# Patient Record
Sex: Female | Born: 1980 | Race: White | Hispanic: No | Smoking: Current every day smoker
Health system: Western US, Academic
[De-identification: ages and names within clinical notes are randomized; demographics above are authoritative.]

## PROBLEM LIST (undated history)

## (undated) DIAGNOSIS — F209 Schizophrenia, unspecified: Secondary | ICD-10-CM

## (undated) DIAGNOSIS — G8929 Other chronic pain: Secondary | ICD-10-CM

## (undated) DIAGNOSIS — F319 Bipolar disorder, unspecified: Secondary | ICD-10-CM

## (undated) DIAGNOSIS — F909 Attention-deficit hyperactivity disorder, unspecified type: Secondary | ICD-10-CM

## (undated) DIAGNOSIS — M549 Dorsalgia, unspecified: Secondary | ICD-10-CM

## (undated) DIAGNOSIS — F419 Anxiety disorder, unspecified: Secondary | ICD-10-CM

## (undated) DIAGNOSIS — R569 Unspecified convulsions: Secondary | ICD-10-CM

## (undated) HISTORY — PX: TONSILLECTOMY: SUR1361

## (undated) HISTORY — PX: TONSILLECTOMY AND ADENOIDECTOMY: SHX001831

## (undated) HISTORY — PX: NO SURGICAL HISTORY: 101002

## (undated) HISTORY — PX: TONSILLECTOMY: SHX001830

---

## 2000-11-25 ENCOUNTER — Other Ambulatory Visit: Payer: Self-pay | Admitting: Emergency Medicine

## 2002-07-24 ENCOUNTER — Ambulatory Visit

## 2002-07-25 ENCOUNTER — Encounter: Admitting: OBSTETRICS/GYN

## 2002-08-02 ENCOUNTER — Ambulatory Visit: Admit: 2002-08-02 | Attending: OBSTETRICS/GYN | Admitting: OBSTETRICS/GYN

## 2002-08-09 ENCOUNTER — Ambulatory Visit

## 2002-08-10 ENCOUNTER — Encounter

## 2002-08-10 ENCOUNTER — Other Ambulatory Visit: Payer: Self-pay | Admitting: OBSTETRICS/GYN

## 2002-08-12 ENCOUNTER — Ambulatory Visit: Admit: 2002-08-12 | Attending: OBSTETRICS/GYN | Admitting: OBSTETRICS/GYN

## 2002-08-15 ENCOUNTER — Ambulatory Visit: Admit: 2002-08-15 | Attending: Reproductive Endocrinology | Admitting: Reproductive Endocrinology

## 2002-08-17 ENCOUNTER — Other Ambulatory Visit: Payer: Self-pay | Admitting: Nurse Practitioner

## 2002-08-20 ENCOUNTER — Ambulatory Visit: Admit: 2002-08-20

## 2002-08-21 ENCOUNTER — Encounter

## 2002-08-22 ENCOUNTER — Encounter

## 2002-08-28 ENCOUNTER — Encounter

## 2002-08-30 ENCOUNTER — Ambulatory Visit

## 2002-08-30 ENCOUNTER — Other Ambulatory Visit: Payer: Self-pay | Admitting: Nurse Practitioner

## 2002-09-04 ENCOUNTER — Encounter

## 2002-09-04 ENCOUNTER — Ambulatory Visit: Admit: 2002-09-04 | Attending: OBSTETRICS/GYN | Admitting: OBSTETRICS/GYN

## 2002-09-14 ENCOUNTER — Ambulatory Visit: Admit: 2002-09-14 | Attending: OBSTETRICS/GYN | Admitting: OBSTETRICS/GYN

## 2002-09-14 ENCOUNTER — Inpatient Hospital Stay: Admission: AD | Admit: 2002-09-14 | Attending: OBSTETRICS/GYN | Admitting: OBSTETRICS/GYN

## 2002-09-14 MED ORDER — OXYTOCIN 10 UNIT/ML INJECTION SOLUTION
10.0000 [IU] | INTRAMUSCULAR | Status: DC | PRN
Start: 2002-09-14 — End: 2002-09-14

## 2002-09-14 MED ORDER — DOCUSATE SODIUM 100 MG TABLET
200.0000 mg | ORAL_TABLET | ORAL | Status: DC
Start: 2002-09-14 — End: 2002-09-16

## 2002-09-14 MED ORDER — HYDROCODONE 5 MG-ACETAMINOPHEN 500 MG TABLET
1.0000 | ORAL_TABLET | ORAL | Status: DC | PRN
Start: 2002-09-14 — End: 2002-09-16

## 2002-09-14 MED ORDER — ERYTHROMYCIN 5 MG/GRAM (0.5 %) EYE OINTMENT
0.5000 % | TOPICAL_OINTMENT | OPHTHALMIC | Status: DC | PRN
Start: 2002-09-14 — End: 2002-09-14

## 2002-09-14 MED ORDER — FENTANYL (PF) 50 MCG/ML INJECTION SOLUTION
100.0000 ug | INTRAMUSCULAR | Status: DC | PRN
Start: 2002-09-14 — End: 2002-09-14

## 2002-09-14 MED ORDER — IBUPROFEN 600 MG OR TABS
600.0000 mg | ORAL_TABLET | ORAL | Status: DC | PRN
Start: 2002-09-14 — End: 2002-09-16

## 2002-09-14 MED ORDER — HYDROCODONE 5 MG-ACETAMINOPHEN 500 MG TABLET
0.0000 | ORAL_TABLET | ORAL | Status: DC | PRN
Start: 2002-09-14 — End: 2002-09-14

## 2002-09-14 MED ORDER — IBUPROFEN 600 MG OR TABS
600.0000 mg | ORAL_TABLET | ORAL | Status: DC | PRN
Start: 2002-09-14 — End: 2002-09-14

## 2002-09-14 MED ORDER — OXYTOCIN 10 UNIT/ML INJECTION SOLUTION
20.0000 [IU] | INTRAMUSCULAR | Status: DC | PRN
Start: 2002-09-14 — End: 2002-09-14

## 2002-09-14 MED ORDER — AQUA-MEPHYTON 1 MG/0.5 ML INJECTION SOLUTION
0.5000 mg | INTRAMUSCULAR | Status: DC | PRN
Start: 2002-09-14 — End: 2002-09-14

## 2002-09-15 MED ORDER — MORPHINE (PF) 0.5 MG/ML INJECTION SOLUTION
5.0000 mg | INTRAMUSCULAR | Status: DC | PRN
Start: 2002-09-15 — End: 2002-09-15

## 2002-09-16 MED ORDER — DEPO-PROVERA 150 MG/ML INTRAMUSCULAR SYRINGE
150.0000 mg | INJECTION | INTRAMUSCULAR | Status: DC
Start: 2002-09-16 — End: 2002-09-16

## 2004-07-03 ENCOUNTER — Emergency Department: Admit: 2004-07-03

## 2004-07-03 NOTE — Progress Notes (Signed)
PATIENT: Kayla Perkins, [facility] LOCATION[facility]  MR #: [id number] SEX: F AGE: 23  DATE OF SERVICE: 07/03/2004 DOB: 1981/03/04      EMERGENCY DEPARTMENT NOTE    CHIEF COMPLAINT: Neck lesion.    HISTORY OF PRESENT ILLNESS:    This 23 year old woman complaining of two red, tender lesions on her chin and neck that have been present for approximately four days. She states that she believes they were an ingrown hair and she admits to self manipulating one of them. She denies fever, chills, nausea, vomiting, shortness of breath or chest pain. She complains of 8/10 pain. She denies any difficulty breathing or difficulty swallowing.    PAST MEDICAL HISTORY:    ALLERGIES: She has no known drug allergies. MEDICATIONS: She takes no medications. PAST MEDICAL/SURGICAL HISTORY: She has no significant medical or surgical history.    SOCIAL HISTORY:    She denies the use of alcohol, tobacco, or illicit substances.    REVIEW OF SYSTEMS:    Review of systems is positive for two neck masses, otherwise the review of systems is negative. Her last menstrual period was approximately two weeks ago, however, she is concerned that she may be pregnant.    PHYSICAL EXAMINATION:    General: She is slightly obese, awake, alert and oriented. Blood pressure 129/79, pulse 84, respiration is 18, temperature 37.3. HEENT: Pupils are equal, round and reactive. TMs are clear. Examination mouth is within normal limits. There are two small abscesses, one under her chin and the other one on the neck, just overlying the trachea. The superior mass is approximately 1 cm, indurated, however does not appear fluctuant and surrounded by minimal erythema. The larger of the two abscesses, right along the trachea, is approximately 2 cm with a fluctuant center and moderately indurated, and surrounding by erythema. Lungs clear to auscultation. Cardiovascular: Regular rate and rhythm.    IMPRESSION AND PLAN:    This is a 23 year old woman with an abscess requiring incision and  drainage.    EMERGENCY DEPARTMENT COURSE:    In the Emergency Department, a pregnancy test was obtained and was negative. Because of the anatomic location of this abscess, it was elected to have ENT evaluate the patient. The patient was seen, examined and the abscess was ID. Please see ENT note for details.    DISCHARGE MEDICATIONS:    The patient was discharged with a prescription for Vicodin and also for Keflex.    DISCHARGE INSTRUCTIONS:    The patient will be moving to [place]. She will follow-up at a county clinic there. She was given dressing materials so she can change the packing twice a day and was instructed in how to do that. She will take all of the antibiotics and use pain medicine as necessary. She will return immediately to the Emergency Room for further swelling or difficulty swallowing or breathing.    FINAL DIAGNOSIS: Neck abscess, status post ID.        THIS WAS ELECTRONICALLY SIGNED - 07/03/2004 6:35 PM PST BY: Weyman Pedro, NP  NURSE PRACTITIONER  EMERGENCY MEDICINE DEPARTMENT        THIS WAS ELECTRONICALLY SIGNED - 07/10/2004 8:18 AM PST BY: Tim Lair, MD    AV:[id number])    D: 07/03/2004 05:57 PM  T: 07/03/2004 06:14 PM  C#: [id number]

## 2007-07-17 ENCOUNTER — Emergency Department: Admit: 2007-07-17 | Attending: Physician Assistant | Admitting: Physician Assistant
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
## 2012-01-09 ENCOUNTER — Ambulatory Visit: Admit: 2012-01-09 | Admitting: OBSTETRICS/GYN

## 2012-01-09 ENCOUNTER — Emergency Department: Admit: 2012-01-09

## 2012-01-09 ENCOUNTER — Ambulatory Visit: Payer: PRIVATE HEALTH INSURANCE

## 2012-01-09 ENCOUNTER — Emergency Department: Admit: 2012-01-09 | Payer: Self-pay

## 2012-01-09 NOTE — ED Nursing Note (Signed)
No answer in waiting room 

## 2012-01-09 NOTE — Progress Notes (Addendum)
R3 Triage Note:    ID: 30yo brought in by ambulance from ED with nausea vomiting and stating she is [redacted] weeks pregnant    S: patient says she thinks she is about [redacted] weeks pregnant as had a +UPT in October at home.  Has had no prenatal care and started light bleeding that resembles her period 3 days ago      Bedisde [place]: empty uterus with endometrial stripe that is 11mm     UPT negative    A/P:  Patient is not pregnant and current bleeding c/w menses.  Will send to ER to evaluate her GI issues.    Electronically Signed By: Jenness Corner, MD      Obstetrics & Gynecology, PGY-3      PI# [phone]      Pager: [phone]   MFM Fellow Note:     I saw and evaluated the patient with the resident(s). I have reviewed the patient's medical history, the resident's physical examination findings, the patient's diagnosis, and the treatment plan. I discussed the patient's care with the resident(s) and we developed the treatment plan together.     I agree with the findings and plan as documented in the resident's note.    This note was electronically signed by  Marny Lowenstein, MD  MFM Fellow  [phone]  Pager [phone]

## 2012-01-09 NOTE — ED Triage Note (Signed)
Pt is [redacted] wks pregnant, c/o diffuse abd pain/back pain with n/v since this morning.
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
## 2012-01-09 NOTE — ED Triage Note (Signed)
Pt c/o vomiting and low back pain that began this morning. Pt dry heaving in triage. Pt was initially sent to L&D as she reported being pregnant. Pt sent back from L&D with neg pregnancy. Denies abd pain. Last bm last night and runny in character.

## 2012-02-18 ENCOUNTER — Emergency Department: Admit: 2012-02-18 | Attending: Emergency Medicine | Admitting: Emergency Medicine

## 2012-02-18 ENCOUNTER — Encounter: Payer: Self-pay | Admitting: Emergency Medicine

## 2012-02-18 DIAGNOSIS — T07XXXA Unspecified multiple injuries, initial encounter: Secondary | ICD-10-CM | POA: Insufficient documentation

## 2012-02-18 DIAGNOSIS — M25532 Pain in left wrist: Secondary | ICD-10-CM | POA: Insufficient documentation

## 2012-02-18 DIAGNOSIS — IMO0002 Reserved for concepts with insufficient information to code with codable children: Secondary | ICD-10-CM | POA: Insufficient documentation

## 2012-02-18 LAB — CBC WITH DIFFERENTIAL
Basophils % Auto: 0.2 %
Basophils % Auto: 0.5 %
Basophils Abs Auto: 0 10*3/uL (ref 0–0.2)
Basophils Abs Auto: 0 10*3/uL (ref 0–0.2)
Eosinophils % Auto: 0.1 %
Eosinophils % Auto: 0.2 %
Eosinophils Abs Auto: 0 10*3/uL (ref 0–0.5)
Eosinophils Abs Auto: 0 10*3/uL (ref 0–0.5)
Hematocrit: 36 % (ref 34–46)
Hematocrit: 35.9 % (ref 34–46)
Hemoglobin: 12.2 GM/DL (ref 12.0–16.0)
Hemoglobin: 12.1 GM/DL (ref 12.0–16.0)
Lymphocytes % Auto: 16.2 %
Lymphocytes % Auto: 20.2 %
Lymphocytes Abs Auto: 1.6 10*3/uL (ref 1.0–4.8)
Lymphocytes Abs Auto: 1.9 10*3/uL (ref 1.0–4.8)
MCH: 32.4 PG (ref 27–33)
MCH: 32.4 PG (ref 27–33)
MCHC: 33.8 % (ref 32–36)
MCHC: 33.8 % (ref 32–36)
MCV: 95.9 UM3 (ref 80–100)
MCV: 96 UM3 (ref 80–100)
MPV: 7.6 UM3 (ref 6.8–10.0)
MPV: 7.6 UM3 (ref 6.8–10.0)
Monocytes % Auto: 4.4 %
Monocytes % Auto: 4.7 %
Monocytes Abs Auto: 0.4 10*3/uL (ref 0.1–0.8)
Monocytes Abs Auto: 0.4 10*3/uL (ref 0.1–0.8)
Neutrophils % Auto: 79.1 %
Neutrophils % Auto: 74.4 %
Neutrophils Abs Auto: 7.7 10*3/uL (ref 1.80–7.70)
Neutrophils Abs Auto: 6.9 10*3/uL (ref 1.80–7.70)
Platelet Count: 232 10*3/uL (ref 130–400)
Platelet Count: 206 10*3/uL (ref 130–400)
RDW: 13.4 UNITS (ref 0–14.7)
RDW: 13.3 UNITS (ref 0–14.7)
Red Blood Cell Count: 3.75 10*6/uL (ref 3.7–5.5)
Red Blood Cell Count: 3.74 10*6/uL (ref 3.7–5.5)
White Blood Cell Count: 9.7 10*3/uL (ref 4.5–11.0)
White Blood Cell Count: 9.3 10*3/uL (ref 4.5–11.0)

## 2012-02-18 LAB — URINALYSIS-COMPLETE
Bilirubin Urine: NEGATIVE
Glucose Urine: NEGATIVE mg/dL
Ketones: NEGATIVE mg/dL
Leuk. Esterase: NEGATIVE
Nitrite Urine: NEGATIVE
Occult Blood Urine: NEGATIVE mg/dL
Protein Urine: NEGATIVE mg/dL
Specific Gravity: 1.006 (ref 1.002–1.030)
Urobilinogen.: NEGATIVE mg/dL (ref ?–2.0)
pH URINE: 7.5 (ref 4.8–7.8)

## 2012-02-18 LAB — HEPATIC FUNCTION PANEL
Alanine Transferase (ALT): 14 U/L (ref 5–54)
Albumin: 3.6 g/dL (ref 3.4–4.8)
Alkaline Phosphatase (ALP): 53 U/L (ref 35–115)
Aspartate Transaminase (AST): 17 U/L (ref 15–43)
Bilirubin Direct: 0.1 mg/dL (ref 0.0–0.2)
Bilirubin Total: 0.6 mg/dL (ref 0.3–1.3)
Protein: 6.8 g/dL (ref 6.3–8.3)

## 2012-02-18 LAB — BASIC METABOLIC PANEL
Calcium: 9 mg/dL (ref 8.6–10.5)
Carbon Dioxide Total: 25 mEq/L (ref 24–32)
Chloride: 105 mEq/L (ref 95–110)
Creatinine Blood: 0.62 mg/dL (ref 0.44–1.27)
E-GFR, African American: >60 SEE NOTE (ref >60)
E-GFR, Non-African American: >60 SEE NOTE (ref >60)
Glucose: 88 mg/dL (ref 70–99)
Potassium: 3.7 mEq/L (ref 3.3–5.0)
Sodium: 137 mEq/L (ref 135–145)
Urea Nitrogen, Blood (BUN): 7 mg/dL — ABNORMAL LOW (ref 8–22)

## 2012-02-18 LAB — TYPE AND SCREEN
Antibody Screen (Ortho Gel): NEGATIVE
Patient Blood Type: O NEG

## 2012-02-18 LAB — LIPASE: Lipase: 16 U/L (ref 13–51)

## 2012-02-18 LAB — INR: INR: 1.07 (ref 0.87–1.18)

## 2012-02-18 MED ORDER — MORPHINE 2 MG/ML INJECTION SYRINGE
2.0000 mg | INJECTION | Freq: Once | INTRAMUSCULAR | Status: AC
Start: 2012-02-18 — End: 2012-02-18
  Administered 2012-02-18: 2 mg via INTRAVENOUS
  Filled 2012-02-18: qty 1

## 2012-02-18 MED ORDER — NACL 0.9% IV BOLUS - DURATION REQ
1000.0000 mL | Freq: Once | INTRAVENOUS | Status: AC
Start: 2012-02-18 — End: 2012-02-18
  Administered 2012-02-18: 1000 mL via INTRAVENOUS

## 2012-02-18 MED ORDER — DIPHTH,PERTUS(ACEL)TETANUS(PF)2LF-(2.5-5-3-5MCG)-5 LF/0.5 ML IM SUSP
0.5000 mL | Freq: Once | INTRAMUSCULAR | Status: AC
Start: 2012-02-18 — End: 2012-02-18
  Administered 2012-02-18: 0.5 mL via INTRAMUSCULAR
  Filled 2012-02-18: qty 0.5

## 2012-02-18 MED ORDER — ONDANSETRON 4 MG DISINTEGRATING TABLET
4.0000 mg | DISINTEGRATING_TABLET | Freq: Once | ORAL | Status: AC
Start: 2012-02-18 — End: 2012-02-18
  Administered 2012-02-18: 4 mg via ORAL
  Filled 2012-02-18: qty 1

## 2012-02-18 NOTE — Consults (Signed)
933 Trauma Code Activation   Patient seen and evaluated by the trauma service within one hour of presentation. We will follow the patient's ED course closely and will be available for admission if indicated. Thank you.       Shreshta Medley, PGY-2  Trauma Service  [phone]

## 2012-02-18 NOTE — ED Nursing Note (Signed)
Pt still in Xrays

## 2012-02-18 NOTE — Discharge Instructions (Signed)
Thank you for choosing [facility] for your emergency health care needs. It has been our privilege to take care of you today.  You have been treated for your symptoms and discharged home. Please take all medicines that are prescribed to you as directed.  It is crucial, if you have a primary care physician, to follow up with him or her in the time frame recommended as many health conditions that seem self-limited initially may actually worsen over time.  If you do not have a primary care physician, we will outline the various resources available for you to find one.     If at any time you feel that your condition is worsening, call your doctor or return to the emergency department for reevaluation.    Please realize that the results of some studies that you had done during your stay with us (such as xrays and cultures) are only preliminarily resulted.  Results of these studies may change as more information becomes available or as the studies are reevaluated by other members of our health care team in the next few days. We will attempt to contact you with any important changes or additions to the studies that were obtained today.
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
## 2012-02-18 NOTE — ED Nursing Note (Signed)
Report back to Angela RN
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
## 2012-02-18 NOTE — ED Nursing Note (Signed)
Assumed care of pt at this time, report from Angela RN

## 2012-02-18 NOTE — ED Nursing Note (Signed)
Notified [place] [employer] at the number listed that the pt is medically clear. Spoke to dispatch who will send an officer to pick up pt to transport to [facility].

## 2012-02-18 NOTE — ED Nursing Note (Signed)
Pt back from Xray, 2nd CBC drawn, pt remains tearful and quiet
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
## 2012-02-18 NOTE — ED Nursing Note (Signed)
Report given to Rochelle, RN.
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
## 2013-03-25 ENCOUNTER — Emergency Department: Admit: 2013-03-25 | Payer: MEDICAID | Attending: Emergency Medicine | Admitting: Emergency Medicine

## 2013-03-25 DIAGNOSIS — F209 Schizophrenia, unspecified: Secondary | ICD-10-CM | POA: Insufficient documentation

## 2013-03-25 DIAGNOSIS — F319 Bipolar disorder, unspecified: Secondary | ICD-10-CM | POA: Insufficient documentation

## 2013-03-25 MED ORDER — DIPHENHYDRAMINE 25 MG CAPSULE
25.0000 mg | ORAL_CAPSULE | Freq: Once | ORAL | Status: AC
Start: 2013-03-25 — End: 2013-03-25
  Administered 2013-03-25: 25 mg via ORAL
  Filled 2013-03-25: qty 1

## 2013-03-25 MED ORDER — DIPHENHYDRAMINE 50 MG CAPSULE
50.0000 mg | ORAL_CAPSULE | Freq: Every day | ORAL | Status: AC | PRN
Start: 2013-03-25 — End: 2013-06-23

## 2013-03-25 NOTE — Allied Health Progress (Signed)
CLINICAL SOCIAL SERVICES   PES Referral  Note Date & Time: 03/25/2013 08:22  Date of Admission: 03/25/2013  7:44 AM    Date of Service: 03/25/2013 Referred By: medical team   Patient Name: Kayla Perkins Ethnicity: The patient's reported ethnicity is , and she identifies as Other.        DOB: 01-Jun-1981  Age: 36yr      INTERVENTION/PLAN  Consulted with medical team; Patient was referred to [facility] for psychiatric evaluation.  Will follow and complete psychiatric diagnostic interview as indicated/appropriate.      Signature: Helane Gunther, [place]    Pager #[phone]  Licensed Clinical Social Worker

## 2013-03-25 NOTE — ED Initial Note (Signed)
EMERGENCY DEPARTMENT PHYSICIAN NOTE - Kayla Perkins       Date of Service:   03/25/2013  7:44 AM Patient's PCP: Tarri Fuller   Note Started: 03/25/2013 07:59 DOB: 21-Feb-1981             Chief Complaint   Patient presents with    Insomnia       The history provided by the patient.  Interpreter used: No    Kayla Perkins is a 32yr old female, with a past medical history significant for bipolar, schizophrenia off meds for 2 years, who presents to the ED with a chief complaint of insomnia that began 4 days ago. She denies SI/HI, current drugs use. Denies hallucinations. Denies depression. Has not tried any OTC medications for sleep. Last episode insomnia 2 years ago. States she has been unable to  Establish care with psychiatrist since moving from [place]. Living in independent living with boyfriend, who is at bedside.     A full history, including pertinent past medical, family and social history was reviewed.    HISTORY:  1    *Bipolar affective      Schizophrenia      Insomnia     No Known Allergies   Past Medical History:    NO SIGNIFICANT HISTORY                                     Past Surgical History:    No surgical history                                           Social History    Marital Status: SINGLE              Spouse Name:                       Years of Education:                 Number of children:               Occupational History    None on file    Social History Main Topics    Smoking Status: Current Every Day Smoker        Packs/Day: 0.00  Years:         Smokeless Status: Not on file                       Alcohol Use: Yes                Comment: "once in a while"    Drug Use: No              Sexual Activity: Not on file          Other Topics            Concern    None on file    Social History Narrative    None on file     No family history on file.             Review of Systems   Constitutional: Positive for fever. Negative for chills.   Respiratory: Negative for cough and shortness of breath.     Cardiovascular: Negative for chest pain and  palpitations.   Gastrointestinal: Negative for abdominal pain and abdominal distention.   Genitourinary: Negative for dysuria.   Neurological: Negative for weakness and headaches.   Psychiatric/Behavioral: Positive for agitation. Negative for suicidal ideas, hallucinations and confusion.   All other systems reviewed and are negative.        TRIAGE VITAL SIGNS:  Temp: 36.7 C (98.1 F) (03/25/13 0744)  Temp src: Oral (03/25/13 0744)  Pulse: 81  (03/25/13 0744)  BP: 125/82 mmHg (03/25/13 0744)  Resp: 18  (03/25/13 0744)  SpO2: 94 % (03/25/13 0744)  Weight: (not recorded)    Physical Exam   Nursing note and vitals reviewed.  Constitutional: She is oriented to person, place, and time. She appears well-developed and well-nourished. No distress.   HENT:   Head: Normocephalic and atraumatic.   Eyes: EOM are normal. Pupils are equal, round, and reactive to light.   Neck: Normal range of motion. Neck supple.   Cardiovascular: Normal rate, regular rhythm, normal heart sounds and intact distal pulses.  Exam reveals no gallop and no friction rub.    No murmur heard.  Pulmonary/Chest: Effort normal and breath sounds normal. No respiratory distress. She has no wheezes. She has no rales.   Abdominal: Soft. Bowel sounds are normal. She exhibits no distension. There is no tenderness. There is no rebound.   Musculoskeletal: Normal range of motion.   Neurological: She is alert and oriented to person, place, and time.   Skin: Skin is warm and dry. No rash noted. She is not diaphoretic. No erythema. No pallor.   Psychiatric:   Odd affect, agitated           INITIAL ASSESSMENT & PLAN, MEDICAL DECISION MAKING, ED COURSE:  Meta Kroenke is a 32yr old female who presents with a chief complaint of insomnia. After history and exam, I feel the diagnosis is most likely manic episode. Differential diagnosis includes, but is not limited to drug intoxication.  The patient is hemodynamically stable and  will require nothing as an immediate intervention. Initial treatment and studies to evaluate this problem will include serial exams and labs.         ED Course Update: The patient was observed in the ED. The results of the ED evaluation were notable for the following:    Pertinent lab results (reviewed and interpreted independently by me):   Urine tox: positive for opiates  UPT: negative     Consults: A Consult was obtained from the [facility] service to evaluate for mania/agitation. Consults Crisis ([facility]): Called (03/25/13 0820)  Pertinent medications: Benadryl 25mg  PO, given for insomnia.        Further MDM  Additional data reviewed? N/A      Interventions:   Benadryl     ED Course: seen and evaluted  Re-evaluated and stable for discharge    Patient Summary: Kayla Perkins is a 32yr old female h/o bipolar/schizophrenia off meds p/w 4 days insomnia and agitation.  however after Benadryl patient reports feeling sleepy and agitation has improved. Utox positive for opiates, UPT negative. Otherwise vss and she is stable for discharge. Counseled on getting PCP and psychiatrist.         LAST VITAL SIGNS:  Temp: 36.7 C (98.1 F) (03/25/13 0948)  Temp src: Oral (03/25/13 0948)  Pulse: 68  (03/25/13 0948)  BP: 112/62 mmHg (03/25/13 0948)  Resp: 14  (03/25/13 0948)  SpO2: 98 % (03/25/13 0948)  Weight: (not recorded)      Clinical Impression: bipolar, schizophrenia, off  meds, insomnia       Anticipated work up needed: Follow up with PCP/psychiatrist      Disposition: Discharge. Follow up with PCP to establish care. ED discharge instructions were reviewed and provided. It was discussed that radiology reports are preliminary and the patient will be contacted for any changes in interpretation.        MDM COMPLEXITY  Overall Complexity of MDM is: moderate          PRESENT ON ADMISSION:  Are any of the following four conditions present or suspected on admission: decubitus ulcer, infection from an intravascular device, infection due to an  indwelling catheter, surgical site infection or pneumonia? No.    PATIENT'S GENERAL CONDITION:  Good: Vital signs are stable and within normal limits. Patient is conscious and comfortable. Indicators are excellent.       Report electronically signed by:  Marny Lowenstein, MD Resident      This patient was seen, evaluated, and care plan was developed with the resident.  I agree with the findings and plan as outlined in our combined note.      Robyn Haber, MD      This chart was electronically signed by Charlotte Crumb, MD - Attending Physician

## 2013-03-25 NOTE — ED Nursing Note (Signed)
Pt in from triage, ambulated with steady gait to room with c/o insomnia for 4 days , gcs 15 , denies SI, HI or any others complaints except , Pt feels "depressed" and "worried" about not being able to sleep and feels it is "not normal', No overt sign of distress , awaiting for Md will follow up

## 2013-03-25 NOTE — ED Triage Note (Signed)
Pt reports hasn't slept in 4 days, denies hx of same. To room

## 2013-03-25 NOTE — ED Nursing Note (Signed)
Pt is provided with specimen cup and encouraged to urinate and provide specimen

## 2013-03-25 NOTE — Discharge Instructions (Signed)
Thank you for choosing [facility] for your emergency health care needs. It has been our privilege to take care of you today. Your primary complaints have been evaluated based on your history, lab tests, imaging tests and you have been treated for your symptoms and discharged home. Please take all medicines that are prescribed to you as directed (see below).  It is crucial, if you have a primary care physician, to follow up with him or her in the time frame recommended as many health conditions that seem self-limited initially may actually worsen over time.  If you do not have a primary care physician, we will outline the various resources available for you to find one.    If at any time you feel that your condition is worsening, call your doctor or return to the Emergency Department for reevaluation. CALL 911 IF YOU THINK YOU ARE HAVING A MEDICAL EMERGENCY. Return to the Emergency Department if you are unable to obtain the recommended follow-up treatment or you are not better as expected. You can call the Medical Center with questions at [phone].    Please realize that the results of some studies that you had done during your stay with us (such as x-rays and cultures) have only preliminarily results at this time.  Results of these studies may change as more information becomes available or as the studies are re-evaluated by other members of our health care team in the next few days. We will attempt to contact you with any important changes or additions to the studies that were obtained today, particularly if any of these results require a change in your treatment.    GENERAL PAIN MEDICATION PRECAUTIONS    You may have been prescribed medications for pain today. Some of these may contain a narcotic (Vicodin, Norco, Percocet, etc.) Take these pain medications as prescribed. You also may have been prescribed a muscle relaxant or anxiolytic (benzodiazepine) such as valium (diazepam) or ativan  (lorazepam). Do not drink alcohol, drive, or operate heavy machinery while taking either narcotic or benzodiazepine medications. All narcotics and benzodiazepines have a risk of dependence, so please use with caution. If you were prescribed Vicodin, Norco, or Percocet, do not take additional products containing Tylenol (acetaminophen), as this can cause an acetaminophen overdose and can damage your liver. Do not take more than 4000mg of acetaminophen daily.    You may also have been prescribed an anti-inflammatory pain medication (NSAID) such as ibuprofen (Motrin, Advil) or naproxen (Aleve). Take the NSAID as prescribed, with food or milk.  Stop taking the NSAID if you develop abdominal pain, vomiting blood or dark/tarry or bloody stools. Be sure to drink plenty of fluids, at least 1-2 liters of water per day while taking an NSAID unless you have congestive heart failure or other condition that requires you to limit your water intake.    We have given you a prescription for Benadryl. You may fill these prescriptions at a pharmacy of your choice. Please take these as directed.    [place] [facility] 24 hour Pharmacy Services    [facility]  [address], [place]      [phone] (will fill [place] [facility] after hours)  [address], [place]   [phone]    [facility]  [address]       [phone]  (until 11pm)    [facility] for Uninsured Without Current Healthcare    [facility], [address] [place], [place]  [phone]    [facility], [address], [place] [phone] (Sunday 8a-2p)    (Last updated 10/05/10)

## 2013-03-25 NOTE — ED Nursing Note (Signed)
Patient was able to fall asleep prior to preparing for d/c home . Pt is off to d/c window

## 2013-04-19 ENCOUNTER — Emergency Department
Admission: EM | Admit: 2013-04-19 | Discharge: 2013-04-20 | Disposition: A | Discharge status: Home / Self Care | Payer: MEDICAID | Attending: Emergency Medicine | Admitting: Emergency Medicine

## 2013-04-19 DIAGNOSIS — R112 Nausea with vomiting, unspecified: Secondary | ICD-10-CM | POA: Insufficient documentation

## 2013-04-19 MED ORDER — FAMOTIDINE 20 MG TABLET
20.0000 mg | ORAL_TABLET | Freq: Once | ORAL | Status: AC
Start: 2013-04-19 — End: 2013-04-19
  Administered 2013-04-19: 20 mg via ORAL
  Filled 2013-04-19: qty 1

## 2013-04-19 MED ORDER — ONDANSETRON 4 MG DISINTEGRATING TABLET
4.0000 mg | DISINTEGRATING_TABLET | Freq: Once | ORAL | Status: AC
Start: 2013-04-19 — End: 2013-04-19
  Administered 2013-04-19: 4 mg via ORAL
  Filled 2013-04-19: qty 1

## 2013-04-19 NOTE — ED Triage Note (Signed)
Pt c/o nausea and vomiting that started this am. Pt c/o dizziness. Pt with nasal congestion and cough. Pt deneis any diarrhea. Pt c/o chills. Pt c/o headache.

## 2013-04-19 NOTE — ED Nursing Note (Signed)
No Answer" note: The patient was called using the ED PA system. The patient was not found after a search of the ED Waiting Room Areas.

## 2013-04-19 NOTE — Progress Notes (Signed)
Emergency Department Triage Note     Subjective: Kayla Perkins is a 32yr old female who presents to the ED with a chief complaint of abdominal pain, nausea and vomiting since this AM. Frequent vomiting with progressively worsening HA, no diarrhea, no known sick contacts, no similar sx prior/chronically, no meds used. Prior pregnancies/deliveries but no known GB disease    Patient's phone number confirmed.     Objective (pertinent exam findings):    BP 116/82  Pulse 95  Temp(Src) 36.8 C (98.2 F) (Oral)  Resp 16  SpO2 97%  LMP 02/08/2013     General: No acute distress, alert, comfortable.  Abdomen/GU: Abnormal - minimal epigastric tenderness    Assessment: Kayla Perkins is a 32yr old female with abdominal pain/vomiting.      Plan: Initial studies to evaluate this problem will include labs and oral antiemetics as needed. Further workup will then proceed in patient care area c/d. ?    Seen and initially evaluated by:  Oneita Jolly, MD, Attending Physician, Department of Emergency Medicine       This patient was seen by a physician in triage. Please see the ED Initial Note for full details of this patient's care. This note covers the brief initial evaluation performed to obtain initial studies to expedite the patient's care. It is not intended to be a comprehensive document of the patient's ED visit.    This patient was instructed that this preliminary assessment and any studies obtained do not constitute a full workup of the patient's chief complaint. The patient was instructed to wait in the assigned area after the triage evaluation to be reevaluated by a physician after initial studies are completed.

## 2013-04-19 NOTE — ED Nursing Note (Signed)
Here for increasing nausea,will medicated per doctors order.

## 2013-04-19 NOTE — ED Nursing Note (Signed)
To triage with steady gait for reassessment, continues with n/v. States has had 3 episodes vomiting with blood tinged emesis since triaged.  Speaking full clear sentences.

## 2013-04-20 ENCOUNTER — Encounter: Payer: Self-pay | Admitting: EMERGENCY MEDICINE

## 2013-04-20 ENCOUNTER — Emergency Department
Admission: EM | Admit: 2013-04-20 | Discharge: 2013-04-21 | Disposition: A | Discharge status: Home / Self Care | Payer: MEDICAID | Attending: Personal Emergency Response Attendant | Admitting: Personal Emergency Response Attendant

## 2013-04-20 ENCOUNTER — Emergency Department (EMERGENCY_DEPARTMENT_HOSPITAL): Payer: MEDICAID

## 2013-04-20 DIAGNOSIS — N390 Urinary tract infection, site not specified: Secondary | ICD-10-CM | POA: Insufficient documentation

## 2013-04-20 MED ORDER — LEVOFLOXACIN 500 MG TABLET
500.0000 mg | ORAL_TABLET | Freq: Once | ORAL | Status: AC
Start: 2013-04-21 — End: 2013-04-20
  Administered 2013-04-20: 500 mg via ORAL
  Filled 2013-04-20: qty 1

## 2013-04-20 MED ORDER — ONDANSETRON HCL 4 MG TABLET
4.0000 mg | ORAL_TABLET | Freq: Three times a day (TID) | ORAL | Status: AC | PRN
Start: 2013-04-20 — End: 2014-04-15

## 2013-04-20 MED ORDER — ONDANSETRON HCL (PF) 4 MG/2 ML INJECTION SOLUTION
4.0000 mg | Freq: Once | INTRAMUSCULAR | Status: AC
Start: 2013-04-20 — End: 2013-04-20
  Administered 2013-04-20: 4 mg via INTRAVENOUS
  Filled 2013-04-20: qty 2

## 2013-04-20 MED ORDER — MORPHINE 2 MG/ML INJECTION SYRINGE
6.0000 mg | INJECTION | Freq: Once | INTRAMUSCULAR | Status: AC
Start: 2013-04-20 — End: 2013-04-20
  Administered 2013-04-20: 4 mg via INTRAVENOUS
  Filled 2013-04-20: qty 1

## 2013-04-20 MED ORDER — ONDANSETRON 4 MG DISINTEGRATING TABLET
4.0000 mg | DISINTEGRATING_TABLET | Freq: Three times a day (TID) | ORAL | Status: AC | PRN
Start: 2013-04-20 — End: 2013-04-27

## 2013-04-20 MED ORDER — LEVOFLOXACIN 250 MG TABLET
500.0000 mg | ORAL_TABLET | Freq: Every day | ORAL | Status: AC
Start: 2013-04-20 — End: 2013-04-27

## 2013-04-20 MED ORDER — ONDANSETRON 4 MG DISINTEGRATING TABLET
4.0000 mg | DISINTEGRATING_TABLET | Freq: Once | ORAL | Status: AC
Start: 2013-04-21 — End: 2013-04-20
  Administered 2013-04-20: 4 mg via ORAL
  Filled 2013-04-20: qty 1

## 2013-04-20 MED ORDER — NACL 0.9% IV BOLUS - DURATION REQ
1000.0000 mL | Freq: Once | INTRAVENOUS | Status: AC
Start: 2013-04-20 — End: 2013-04-20
  Administered 2013-04-20: 1000 mL via INTRAVENOUS

## 2013-04-20 MED ORDER — MORPHINE 2 MG/ML INJECTION SYRINGE
4.0000 mg | INJECTION | INTRAMUSCULAR | Status: DC | PRN
Start: 2013-04-20 — End: 2013-04-20
  Administered 2013-04-20: 4 mg via INTRAVENOUS
  Filled 2013-04-20: qty 1

## 2013-04-20 MED ORDER — LEVOFLOXACIN 500 MG TABLET
500.0000 mg | ORAL_TABLET | Freq: Every day | ORAL | Status: AC
Start: 2013-04-20 — End: 2013-04-23

## 2013-04-20 NOTE — ED Progress Note (Signed)
Emergency Department Triage Note     Subjective: Kayla Perkins is a 32yr old female who presents to the ED with a chief complaint of fatigue.     Patient's phone number confirmed -  none     Objective:    BP 120/71  Pulse 77  Temp(Src) 36.7 C (98.1 F) (Oral)  Resp 16  SpO2 96%  LMP 02/08/2013     General:  No acute distress  HEENT:  Atraumatic  Neurologic:  Normal speech.  Normal tone.    Skin:  Dry    Plan:    POC UPT ORDERED    .Further evaluation will proceed in the Emergency Department.  Labs from < 24 hours ago available    Seen and initially evaluated by:  Rodney Langton, MD, Attending Physician, Department of Emergency Medicine       This patient was seen by a physician in triage. Please see the ED Initial Note for full details of this patient's care. This note covers the brief initial evaluation performed to obtain initial studies to expedite the patient's care. It is not intended to be a comprehensive document of the patient's ED visit.    This patient was instructed that this preliminary assessment and any studies obtained do not constitute a full workup of the patient's chief complaint. The patient was instructed to wait in the assigned area after the triage evaluation to be reevaluated by a physician after initial studies are completed.

## 2013-04-20 NOTE — ED Nursing Note (Signed)
Assist MD at bedside for Bi-manual exam. Per MD no mass or tenderness. Pt provided with juice and crackers. Up for dispo.

## 2013-04-20 NOTE — ED Nursing Note (Signed)
MD spoke with pt regarding need for I&O cath. Pt agreeable. I&O cath done. Pt tolerated well. UA sample sent to lab.

## 2013-04-20 NOTE — ED Triage Note (Signed)
Pt states seen yesterday for same, but eloped.  Pt states abd pain increasing today.  Ambulated with steady gait.  States gets dizzy at times.  Also with n/v.  Denies diarrhea or fevers.

## 2013-04-20 NOTE — ED Nursing Note (Signed)
Pt called for re-eval; pt states "My liver, my kidneys, everything hurts". Gait steady.

## 2013-04-20 NOTE — Discharge Instructions (Signed)
[facility] (Pregnancy and Infants), [phone]    [facility]  Health,vision,dental,BH for undocumented Children  [phone]    [facility], [address]  [phone] / [phone] 8am-5pm    Mon - Fri    [facility], Screen for uninsured Children [phone]    [facility], [address] [phone]    [facility], [address]  [phone] 8am-5pm Mon-Th, 9:45am-5pm Fri  [phone]    [facility], [address]  [phone]  7:45am - 5pm Mon - Fri, NO Medi Cal    Health Programs for the Uninsured  Medi Cal and Healthy Families, [phone]    Major Risk Medical Insurance Program  Preexisting condition and denied coverage  [phone]    [facility], [address]  [phone] / [phone], 8am - 5pm Mon - Fri  No Primary Care, Family Planning and STD    [facility]  (Children's primary care)  [address]  am - 5pm Mon - Fri, 9am - 12noon Sat    [facility], [address]  [address], [phone], 8am - 9pm    [facility], [address]  [phone], 8am - 5pm  Mon - Fri

## 2013-04-20 NOTE — ED Nursing Note (Signed)
Patient discharged from ED with AVS, Rx, related instructions and all belongings. Patient is in NAD, is awake/alert skin, is pink/warm/dry. Pt leaves the ED ambulatory with friends.

## 2013-04-20 NOTE — ED Initial Note (Signed)
EMERGENCY DEPARTMENT PHYSICIAN NOTE - Jerre Diguglielmo       Date of Service:   04/20/2013  4:57 PM Patient's PCP: No Pcp No Pcp   Note Started: 04/20/2013 17:40 DOB: 05-12-1981             Chief Complaint   Patient presents with    Abd Pain Active           The history provided by the patient.  Interpreter used: No    Kayla Perkins is a 32yr old female, with a no significant past medical history, who presents to the ED with a chief complaint of 10/10  back/abdomen pain that began morning of 7/16, day PTA. Certain movements make her symptoms worse. Patient states yesterday morning she had sudden onset of left sided back pain radiating to right side of back and abdomen, at rest when it started. Pain was accompanied N/V X 3. No black or bloody stools. No diarrhea. No constipation.   She denies any dysuria, hematuria. No vaginal discharge or pelvic pain. No heavy lifting or trauma. . . She was seen yesterday at [facility] but left prior to evaluation by MD due to 'loud noises causing migraines'. Today she became lightheaded upon standing from the toilet and decided to return to ED for continuance of symptoms with the addition of lightheadeness. She endorses no current headache. No personal or family history of kidney stones. NO trauma. No cough/SOB. No CP. No rash. No joint swelling.    Quality: sharp  Location: left flank  Severity: 10/10  Time Course: constant  Progression: unchanged  Duration: 2 days  Palliative factors: nothing makes it better.  Provocative factors: certain movements make it worse.  Associated symptoms: N/V  Pertinent negatives: No diarrhea, no constipation    A full history, including pertinent past medical, family and social history was reviewed.    HISTORY:  Medicines---none No Known Allergies   Past Medical History:    NO SIGNIFICANT HISTORY                                     Past Surgical History:    No surgical history                                            Tonsillectomy                                                  Social History    Marital Status: DIVORCED            Spouse Name:                       Years of Education:                 Number of children:               Occupational History   unemployed    Social History Main Topics    Smoking Status: Current Every Day Smoker        Packs/Day: 0.00  Years:         Smokeless Status: Not on file  Alcohol Use: Yes                Comment: "once in a while"    Drug Use: No              Sexual Activity: Not on file          Other Topics            Concern    None on file    Social History Narrative    None on file     FAMILY HISTORY--negative for diabetes             Review of Systems   Constitutional: Positive for appetite change and fatigue. Negative for chills.   HENT: Negative for congestion, sore throat and neck pain.    Respiratory: Negative for shortness of breath.    Cardiovascular: Negative for chest pain.   Gastrointestinal: Positive for nausea, vomiting and abdominal pain.   Genitourinary: Positive for flank pain. Negative for dysuria, hematuria, vaginal discharge, enuresis and vaginal pain.   Musculoskeletal: Positive for back pain. Negative for joint swelling.   Skin: Negative for rash.   Neurological: Positive for headaches.   Psychiatric/Behavioral: Negative for confusion.   All other systems reviewed and are negative.        TRIAGE VITAL SIGNS:  Temp: 36.7 C (98.1 F) (04/20/13 1333)  Temp src: Oral (04/20/13 1333)  Pulse: 77 (04/20/13 1333)  BP: 120/71 mmHg (04/20/13 1333)  Resp: 16 (04/20/13 1333)  SpO2: 96 % (04/20/13 1333)  Weight: (not recorded)    Physical Exam   Nursing note and vitals reviewed.  Constitutional: She is oriented to person, place, and time. She appears well-developed and well-nourished.   Patient is alert and appropriate  Patient appears fatigued but nontoxic     HENT:   Head: Normocephalic and atraumatic.   Right Ear: External ear normal.   Left Ear: External ear normal.   Mouth/Throat: No oropharyngeal  exudate.   Eyes: Conjunctivae and EOM are normal. Pupils are equal, round, and reactive to light. Right eye exhibits no discharge. Left eye exhibits no discharge. No scleral icterus.   Neck: Normal range of motion. Neck supple. No JVD present. No tracheal deviation present. No thyromegaly present.   Cardiovascular: Normal rate, regular rhythm, normal heart sounds and intact distal pulses.  Exam reveals no gallop and no friction rub.    No murmur heard.  Pulmonary/Chest: Effort normal and breath sounds normal. No stridor. No respiratory distress. She has no wheezes. She has no rales. She exhibits no tenderness.   Abdominal: Soft. Bowel sounds are normal. She exhibits no distension and no mass. There is no hepatosplenomegaly, splenomegaly or hepatomegaly. There is no tenderness. There is no rigidity, no rebound, no guarding, no CVA tenderness, no tenderness at McBurney's point and negative Murphy's sign. No hernia. Hernia confirmed negative in the ventral area.   No obvious CVA tenderness   Genitourinary:   Pelvic exam----no CMT, uterus nontender   Musculoskeletal: Normal range of motion. She exhibits no edema and no tenderness.   No sinus tenderness      No CVA tenderness  Legs symmetric in size/nontender/no erythema  Motor symmetric     Sensory grossly intact  Extremities are warm and perfused with symmetric distal pulses and positive cap refill     Lymphadenopathy:     She has no cervical adenopathy.   Neurological: She is alert and oriented to person, place, and time. No cranial nerve deficit. She  exhibits normal muscle tone. Coordination normal.   Skin: Skin is warm. No rash noted. No erythema. No pallor.   Psychiatric: She has a normal mood and affect. Her behavior is normal. Judgment and thought content normal.     The labs below reviewed by myself; interpretation is in the Medical Decision Making Section    Results for orders placed during the hospital encounter of 04/20/13   CBC AUTO + REFLEX MANUAL DIFF      Status: None       Result Value Range Status    WHITE BLOOD CELL COUNT 6.9   Final    RED CELL COUNT 3.82   Final    HEMOGLOBIN 12.5   Final    HEMATOCRIT 37.1   Final    MCV 97.1   Final    MCH 32.7   Final    MCHC 33.7   Final    RDW 14.1   Final    MPV 7.7   Final    PLATELET COUNT 290   Final    NEUTROPHILS % AUTO 58.6   Final    LYMPHOCYTES % AUTO 34.6   Final    MONOCYTES % AUTO 6.2   Final    EOSINOPHIL % AUTO 0.4   Final    BASOPHILS % AUTO 0.2   Final    NEUTROPHIL ABS AUTO 4.00   Final    LYMPHOCYTE ABS AUTO 2.4   Final    MONOCYTES ABS AUTO 0.4   Final    EOSINOPHIL ABS AUTO 0   Final    BASOPHILS ABS AUTO 0   Final   BASIC METABOLIC PANEL     Status: Abnormal       Result Value Range Status    SODIUM 140   Final    POTASSIUM 4.4   Final    CHLORIDE 109   Final    CARBON DIOXIDE TOTAL 22 (*)  Final    UREA NITROGEN, BLOOD (BUN) 10   Final    CREATININE BLOOD 0.75   Final    E-GFR, AFRICAN AMERICAN >60   Final    E-GFR, NON-AFRICAN AMERICAN >60   Final    GLUCOSE 83   Final    CALCIUM 8.9   Final   URINALYSIS-COMPLETE     Status: Abnormal       Result Value Range Status    COLLECTION Clean Catch   Final    COLOR Yellow   Final    CLARITY Sl Turbid   Final    SPECIFIC GRAVITY 1.019   Final    pH URINE 6.5   Final    OCCULT BLOOD URINE Negative   Final    BILIRUBIN URINE Negative   Final    KETONES 40 (*)  Final    GLUCOSE URINE Negative   Final    PROTEIN URINE Negative   Final    UROBILINOGEN. Negative   Final    NITRITE URINE POSITIVE (*)  Final    LEUK. ESTERASE LARGE (*)  Final    MICROSCOPIC INDICATED   Final    WBC 19 (*)  Final    RBC 2   Final    BACTERIA/HPF MANY (*)  Final    SQUAMOUS EPI 4 (*)  Final    MUCOUS/LPF MODERATE (*)  Final   HEPATIC FUNCTION PANEL     Status: Abnormal       Result Value Range Status    PROTEIN 7.0  Final    ALBUMIN 3.8   Final    ALKALINE PHOSPHATASE (ALP) 56   Final    ASPARTATE TRANSAMINASE (AST) 21   Final    BILIRUBIN TOTAL 1.1   Final    ALANINE TRANSFERASE (ALT)  14   Final    BILIRUBIN DIRECT 0.4 (*)  Final   LIPASE     Status: None       Result Value Range Status    LIPASE 17   Final   URINALYSIS-COMPLETE     Status: Abnormal       Result Value Range Status    COLOR None   Final    CLARITY Clear   Final    SPECIFIC GRAVITY 1.010   Final    pH URINE 7.0   Final    OCCULT BLOOD URINE Negative   Final    BILIRUBIN URINE Negative   Final    KETONES 20 (*)  Final    GLUCOSE URINE Negative   Final    PROTEIN URINE Negative   Final    UROBILINOGEN. Negative   Final    NITRITE URINE Negative   Final    LEUK. ESTERASE MODERATE (*)  Final    MICROSCOPIC INDICATED   Final    WBC 4 (*)  Final    RBC 1   Final    BACTERIA/HPF Few   Final    SQUAMOUS EPI <1   Final    MUCOUS/LPF Few   Final           INITIAL ASSESSMENT & PLAN, MEDICAL DECISION MAKING, ED COURSE:  Kayla Perkins is a 32yr old female who presents with a chief complaint of flank pain/abdominal pain    Differential diagnosis includes rena colic,  pyelonephritis,  trauma,  pneumonia,  musculoskeletal,  UTI, no pelvic pain, other    Patient appears volume depleted and will hydrate.        ADDENDUM---------VSS. NO fever. Pain improved with morphine. Nausea improved with zofran.     Abdomen is soft with no specific area of TTP        No CVA TTP    Patient presents with flank pain (abdominal pain). No signs of pneumonia-----no cough, no SOB. Doubt pyelonephritis-----no classic CVA TTP, no fever,  UA with minimal infection-----will treat.  R/O renal colic-----CTA with no renal stones. No signs of appy-----RLQ nontender, no fever, CTA with no appendicits.  LFTs and lipase are normal-----no obvious hepatitis/pancreatitis.  H&P not c/w biliary colic. Patient's pain appears positional-----improved with pain meds---------possible musculoskeletal pain----pain improved with morphine. .      Patient appears dehydrated-------chem 7 with no gap-----HCO3 22----patient hydrated.    Supportive care continues.           ADDENDUM-----VSS. NO  fever. Patient's symptoms improved,.     Abdomen is soft and nontender        No CVA TTP    Exact etiology of patient's pain unclear. No obvious signs of pyelonephritis, renal colic, pneumonia, appy. No pain on pelvic exam. R/O musculoskeletal pain. Will treat UTI.     Patient's symptoms improved.. Patient taking POs with no problems. Patient ambulatory with no problems. Stable for discharge. Patient understands she requires close follow-up.     Patient to return for worse flank pain, persistent flank pain, fever, chills, nausea, vomiting, abdominal psain, hematuria, cough, weakness, dizziness.     During the patient's Emergency Department's course, I performed repeated re-assessments to assess clinical condition and response  to therapy. This included interval histories and focused re-examinations. I reviewed the patient's course multiple occasions with the housestaff.       .  ED Course Update: The patient was observed in the ED. The results of the ED evaluation were notable for the following:    Pertinent lab results   SEE LABS ABOVE   (reviewed and interpreted independently by me):   CBC: Normal, significant for no elevated WBC, no anemia causing dizziness  BMP: Significant for normal Cr, anion gap 11  Hepatic Panel: Slight direct hyperbilirubinemia, likely secondary to hemolysis    UA: First sample contaminated, evident by 4 SC.   UA #2: 4 WBC +LE, clean sample    Pertinent imaging results (reviewed and interpreted independently by me):   CT A/P: No ureterolithiasis, no appy,  no other abnormalities     Pertinent medications: morphine, zofran, given for pain and nausea  .  Chart Review: I reviewed the patient's prior medical records. Pertinent information that is relevant to this encounter includes patient's medical history.    Interventions: IV fluid, morphine, zofran    Patient Summary: Kayla Perkins is a 32 years old female with no significant medical history who presented to the ED following 2 days of L>R  flank  pain radiating to the abdomen. No obvious pyelonephritis, pneumonia, appy, renal colic, pelvic pain. Pain may be musculoskeletal pain. Will treat UTI, doubt STD but levoquin will provide coverage. Will treat musculoskeletal pain.         LAST VITAL SIGNS:  Temp: 36.6 C (97.9 F) (04/20/13 1543)  Temp src: Oral (04/20/13 1543)  Pulse: 63 (04/20/13 1543)  BP: 117/68 mmHg (04/20/13 1543)  Resp: 18 (04/20/13 1543)  SpO2: 99 % (04/20/13 1543)  Weight: (not recorded)      Clinical Impression:   1. Acute UTI  2. Acute musculoskeletal flank pain  3. Acute dehydration      Anticipated work up needed: follow up with primary MD in 1-2 days      Disposition: Discharge. Follow up with PCP. ED discharge instructions were reviewed and provided. It was discussed that radiology reports are preliminary and the patient will be contacted for any changes in interpretation.      PATIENT'S GENERAL CONDITION:  The patient is to be discharged home in stable and inproved condition for outpatient follow-up. The patient is ambulatory and tolerating PO intake. Patient has been given appropriate discharge instructions. Patient understands symptoms to watch for and patient will return for any worsening/recurrent symptoms.        Report electronically signed by:  Marius Ditch, MD Intern    This report electronically signed by: Ree Kida, MD, ATTENDING    The patient was seen, evaluated, and care plan was developed with the resident, Dr. Christene Slates. I agree with the findings and plan as outlined in our combined note. Ree Kida, MD   Attending Physician

## 2013-04-20 NOTE — ED Nursing Note (Signed)
When called to come back to room, pt amb with steady gait to triage desk. Pt asking if she can bring her WC. Pt walked WC back to room with no issues. Pt changing into gown to await MD eval.

## 2013-04-20 NOTE — ED Nursing Note (Signed)
Pt awaits ct, no changes in condition

## 2013-04-20 NOTE — ED Nursing Note (Signed)
Assumed care, pt co persistent abd pain. Pt reports about 2 days. States it is simply "pain" denies cramps or sharp qualities. Also reports nausea. piv inserted.

## 2013-04-21 DIAGNOSIS — N390 Urinary tract infection, site not specified: Secondary | ICD-10-CM | POA: Insufficient documentation

## 2013-05-07 ENCOUNTER — Emergency Department
Admission: EM | Admit: 2013-05-07 | Discharge: 2013-05-07 | Disposition: A | Discharge status: Home / Self Care | Payer: MEDICAID | Attending: Emergency Medicine | Admitting: Emergency Medicine

## 2013-05-07 DIAGNOSIS — G4700 Insomnia, unspecified: Secondary | ICD-10-CM | POA: Insufficient documentation

## 2013-05-07 MED ORDER — TRAZODONE 50 MG TABLET
50.0000 mg | ORAL_TABLET | Freq: Every day | ORAL | Status: AC
Start: 2013-05-07 — End: 2013-06-07

## 2013-05-07 MED ORDER — OLANZAPINE 5 MG DISINTEGRATING TABLET
5.0000 mg | DISINTEGRATING_TABLET | Freq: Once | ORAL | Status: AC
Start: 2013-05-07 — End: 2013-05-07
  Administered 2013-05-07: 5 mg via ORAL
  Filled 2013-05-07: qty 1

## 2013-05-07 NOTE — ED Nursing Note (Signed)
PES at bedside.

## 2013-05-07 NOTE — Allied Health Progress (Signed)
CLINICAL SOCIAL SERVICES   CRISIS SERVICES PROGRESS NOTE  Note Date and Time: 05/07/2013    12:41  Date of Admission: 05/07/2013 10:07 AM    Patient Name: Kayla Perkins Referral Source: MD   DOB: 06-Mar-1981  Age: 73yr     Patient seen and evaluated.  Patient does not meet 5150 criteria and may be discharged by MD when medically cleared.  Full note to follow.        Signature: Jola Schmidt, SW       Pager # [phone]   Licensed Clinical Social Worker

## 2013-05-07 NOTE — ED Initial Note (Signed)
EMERGENCY DEPARTMENT PHYSICIAN NOTE - Kayla Perkins       Date of Service:   05/07/2013 10:07 AM Patient's PCP: No Pcp No Pcp   Note Started: 05/07/2013 11:29 DOB: 20-Aug-1981             Chief Complaint   Patient presents with    URI Symptoms Stable     The history provided by the patient.  Interpreter used: No    Kayla Perkins is a 32yr old female, with a past medical history significant for schizophrenia and ADHD, who presents to the ED with a chief complaint of insomnia that began several weeks ago. Pt notes that she has not been taking psych meds and cannot recall last time she was on psych meds. Pt has been hypervigilant and hearing voices. Symptoms have been constant, with no modifying factors. She denies SI or HI, other associated symptoms, or any other medical complaints at this time. Pt currently on SSI, and lives in a boarding home.     A full history, including pertinent past medical, family and social history was reviewed.    HISTORY:  There are no hospital problems to display for this patient.   No Known Allergies   Past Medical History:    NO SIGNIFICANT HISTORY                                     Past Surgical History:    No surgical history                                            Tonsillectomy                                                 Social History    Marital Status: DIVORCED            Spouse Name:                       Years of Education:                 Number of children:               Occupational History    None on file    Social History Main Topics    Smoking Status: Current Every Day Smoker        Packs/Day: 0.00  Years:         Smokeless Status: Not on file                       Alcohol Use: Yes                Comment: "once in a while"    Drug Use: No              Sexual Activity: Not on file          Other Topics            Concern    None on file    Social History Narrative    None on file     No family history  on file.             Review of Systems   Constitutional: Negative for  fever and chills.   Gastrointestinal: Negative for nausea and vomiting.   Psychiatric/Behavioral: Positive for hallucinations. Negative for suicidal ideas (or HI).   All other systems reviewed and are negative.        TRIAGE VITAL SIGNS:  Temp: 36.4 C (97.5 F) (05/07/13 0943)  Temp src: Oral (05/07/13 0943)  Pulse: 78 (05/07/13 0943)  BP: 118/71 mmHg (05/07/13 0943)  Resp: 16 (05/07/13 0943)  SpO2: 97 % (05/07/13 0943)  Weight: (not recorded)    Physical Exam   Nursing note and vitals reviewed.  Constitutional: She is oriented to person, place, and time. She appears well-developed and well-nourished. She appears distressed.   HENT:   Head: Normocephalic and atraumatic.   Right Ear: External ear normal.   Left Ear: External ear normal.   Mouth/Throat: Oropharynx is clear and moist.   Neck: Normal range of motion. Neck supple.   Cardiovascular: Normal rate, regular rhythm and normal heart sounds.  Exam reveals no gallop and no friction rub.    No murmur heard.  Pulmonary/Chest: Effort normal and breath sounds normal. No stridor. No respiratory distress. She has no wheezes. She has no rales.   Abdominal: Soft. She exhibits no distension. There is no tenderness. There is no rebound and no guarding.   Musculoskeletal: Normal range of motion. She exhibits no edema and no tenderness.   Neurological: She is alert and oriented to person, place, and time.   Strength 5/5 with plantarflexion, dorsiflexion,and hip flexion/extension bilaterally. Strength 5/5 with bicep flexion, tricep flexion, and grip bilaterally     Skin: Skin is warm and dry. No rash noted. She is not diaphoretic. No erythema.   Psychiatric: Judgment and thought content normal.   Tearful, pressured speech           INITIAL ASSESSMENT & PLAN, MEDICAL DECISION MAKING, ED COURSE:  [name] is a 32yr old female who presents with a chief complaint of insomnia. After history and exam, I feel the diagnosis is most likely psychosis. The patient is hemodynamically  stable and will require monitoring as an immediate intervention. Initial treatment and studies to evaluate this problem will include meds and PES consult.     ED Course Update: The patient was observed in the ED. The results of the ED evaluation were notable for the following:    Consults: A Consult was obtained from the [facility] service to evaluate pt. They report pt is safe for discharge. Consults Crisis (PES): Called (05/07/13 1133)    Pertinent medications:   Olanzapine (ZYPREXA ZYDIS) Rapid Dissolve Tablet 5 mg       Further MDM  Additional data reviewed? N/A    ED Course:   11:00 Patient was seen and evaluated.   11:33 Consults: A consult was obtained from the [facility] department for evaluation of patient.    12:41 Patient given discharge instructions     Patient Summary: Kayla Perkins is a 32yr old female with PMH of Schizophrenia presenting to the ED today with insomnia. She cannot remember the last time she was on meds for her schizophrenia. No SI or HI. PES was consulted and they stated pt was safe for discharge. Patient treated here with Zyprexa. Patient to be discharged, Rx given for Trazadone. Instructed to follow-up with PCP in 1-2 days. All questions answered, return precautions discussed.      LAST VITAL SIGNS:  Temp: 36.4 C (97.5 F) (05/07/13 0943)  Temp src: Oral (05/07/13 0943)  Pulse: 78 (05/07/13 0943)  BP: 118/71 mmHg (05/07/13 0943)  Resp: 16 (05/07/13 0943)  SpO2: 97 % (05/07/13 0943)  Weight: (not recorded)      Clinical Impression: 1. Insomnia      Anticipated work up needed: PCP      Disposition: Discharge. Follow up with PCP. ED discharge instructions were reviewed and provided. It was discussed that radiology reports are preliminary and the patient will be contacted for any changes in interpretation.        MDM COMPLEXITY  Overall Complexity of MDM is: low    PATIENT'S GENERAL CONDITION:  Good: Vital signs are stable and within normal limits. Patient is conscious and comfortable. Indicators are  excellent.     DISCLAIMER:   This document serves as my personal record of services taken in my presence. It was created on 05/07/2013 on my behalf by Aundria Rud, a trained medical scribe.     I have reviewed this document and agree that this note accurately reflects the history and exam findings, the patient care provided, and my medical decision making.    05/07/2013  Electronically Signed By: Electronically signed by Raphael Gibney. Crimson Dubberly MD Attending Physician  Attending Physician, Department of Emergency Medicine   [facility]

## 2013-05-07 NOTE — ED Nursing Note (Signed)
Pt given d/c instructions, ambulated with steady gait out of dept

## 2013-05-07 NOTE — ED Triage Note (Signed)
C/o nasal conjestion and unable to sleep at nite due to he allergies.

## 2013-05-07 NOTE — Allied Health Progress (Signed)
CLINICAL SOCIAL SERVICES   CRISIS SERVICES PSYCHIATRIC DIAGNOSTIC INTERVIEW  Note Date and Time: 05/07/2013    15:43  Date of Admission: 05/07/2013 10:07 AM    Date of Service: 05/07/2013 Referred By: medical team   Patient Name: Kayla Perkins Ethnicity: The patient's reported ethnicity is , and she identifies as Not Hispanic or Latino.        DOB: Oct 13, 1980  Age: 32yr      Referral Source:Emergency Department: Attending  Reason for Referral:Psychiatric Evaluation    Language: English  Interpreter : No  Persons Interviewed: Patient and her boyfriend at bedside with her.      HISTORY OF PRESENTING ILLNESS  Patient came to the ED with complaint of insomnia that began several weeks ago.  She has a past history per her report of ADHD, Schizophrenia and a Bipolar Disorder.  She states she should be on meds and does better when on them, but can't remember the names of any or dosages and isn't sure when she last was on them consistently.  It was at least several years ago.  She is in recovery in a clean and sober living home along with her boyfriend.  She has gone to the [facility]ness and Recovery centers here in [place] and apparently has an appointment to see a county psychiatrist on the 19th of this month, but is wanting something to help with sleep until then.  She has no thoughts of SI or HI, but does states she is anxious, can't sleep, is heaving voices etc.  She also reports problems with focus.  Part of this may be due to having been off all her usual substances of addiction now for a period of time.  She previously used meth and weed extensively.  She is wanting some medication to help her sleep at this time, but overall thinks she can cope until her psychiatric appointment in a few weeks.  MD wanted our evaluation as well to be assured she is stable enough to continue on an outpatient basis and to help direct them what medications etc to give if any.    PAST PSYCHIATRIC HISTORY(include family)  Patient reports  a history of ADHD, Schizophrenia and more recently a Bipolar Disorder.  I suspect this is the right one, given her current behavior and her symptoms.  She is on no meds currently and can't remember the names of what she was on.  She has been hospitalized in the past, but not for a number of years per her report.  We have no history of her either.    PAST MEDICAL HISTORY  See medical notes.  HISTORY OF SUBSTANCE ABUSE  Patient has a history of chronic abuse of meth and canabis per her own report.  She states she stopped all and has been clean and sober for a number of months and is in a clean and sober living program.    PSYCHOSOCIAL HISTORY  Patient lives at a clean and sober living program.  Her boyfriend lives there too.  She is single, and no children live with her.  She states she had two children, on died, and the other lives with the father.  She seems to have no relationship with this child.  Patient states she is on SSI and has Education officer, museum.    MENTAL STATUS EXAM  Alert: yes  Oriented: Person, Place, Date and Time  Appearance: appropriate  Memory: Long-term Intact:  yes  Behaviour: agitated  Attitude: cooperative  Eye Contact: fair  Affect: labile  Mood: anxious  Speech: pressured  Ability to Communicate: fair  Concentration: fair  Comprehension: fair  Insight: fair  Judgement: fair  Decision Making Capacity: fair  Impulse Control: fair  Intellectual Functioning: normal  Thought Process: intact  Thought Content: appropriate  Current Suicidal Ideation: no Plan:    Current Homicidal Ideation: no  Plan:     ASSESSMENT  Patient is a 32 year old with an unclear psychiatric history, but likely a bipolar disorder by her symptoms and presentation.  She is not currently on any meds or connected with any agency.  She has been referred and has an appointment to see a psychiatrist on the 19th of this month through the [facility].  She feels she needs help with sleep now and can't wait for  this appointment.  She denies SI or HI and has no other acute symptoms or safety issues. She is in a structured clean and sober living program and is hoping to get medications to help her with sleep so she can stay in this program and function in it.  She does not meet 5150 criteria at this time.         DSM-IV Diagnosis  Axis I: (Bipolar Disorder NOS 296.80, Amphetamine Abuse Hx, Canabis Abuse Hx)   Axis II: (Deferred)    Axis III: (None)      PLAN/DISPOSITION  Patient does not meet 5150 criteria and will be medically cleared.  She will be given prescription for Trazadone by MD to assisst with sleep until she can get in to see the psychiatrist on 05/23/13, in a few weeks.        Signature: Jola Schmidt LCSW                      Licensed Clinical Social Worker                      Pager [phone]

## 2013-07-30 ENCOUNTER — Emergency Department
Admission: EM | Admit: 2013-07-30 | Discharge: 2013-07-30 | Disposition: A | Discharge status: Home / Self Care | Payer: MEDICAID | Attending: Emergency Medicine | Admitting: Emergency Medicine

## 2013-07-30 ENCOUNTER — Emergency Department (EMERGENCY_DEPARTMENT_HOSPITAL): Payer: MEDICAID

## 2013-07-30 ENCOUNTER — Emergency Department: Admit: 2013-07-30 | Discharge: 2013-07-30 | Disposition: A | Discharge status: Still In Hospital | Payer: MEDICAID

## 2013-07-30 ENCOUNTER — Observation Stay
Admission: AD | Admit: 2013-07-30 | Discharge: 2013-07-30 | DRG: 951 | Disposition: A | Discharge status: Home / Self Care | Payer: MEDICAID | Source: Ambulatory Visit | Attending: Obstetrics & Gynecology | Admitting: Obstetrics & Gynecology

## 2013-07-30 DIAGNOSIS — F319 Bipolar disorder, unspecified: Secondary | ICD-10-CM | POA: Insufficient documentation

## 2013-07-30 DIAGNOSIS — F209 Schizophrenia, unspecified: Secondary | ICD-10-CM | POA: Insufficient documentation

## 2013-07-30 DIAGNOSIS — Z3202 Encounter for pregnancy test, result negative: Principal | ICD-10-CM | POA: Insufficient documentation

## 2013-07-30 DIAGNOSIS — Z87440 Personal history of urinary (tract) infections: Secondary | ICD-10-CM | POA: Insufficient documentation

## 2013-07-30 DIAGNOSIS — M549 Dorsalgia, unspecified: Secondary | ICD-10-CM | POA: Insufficient documentation

## 2013-07-30 MED ORDER — KETOROLAC 15 MG/ML INJECTION SOLUTION
15.0000 mg | Freq: Once | INTRAMUSCULAR | Status: AC
Start: 2013-07-30 — End: 2013-07-30
  Administered 2013-07-30: 15 mg via INTRAVENOUS
  Filled 2013-07-30: qty 1

## 2013-07-30 MED ORDER — HYDROCODONE 10 MG-ACETAMINOPHEN 325 MG TABLET
1.0000 | ORAL_TABLET | Freq: Once | ORAL | Status: AC
Start: 2013-07-30 — End: 2013-07-30
  Administered 2013-07-30: 1 via ORAL
  Filled 2013-07-30: qty 1

## 2013-07-30 MED ORDER — HYDROCODONE 5 MG-ACETAMINOPHEN 325 MG TABLET
1.0000 | ORAL_TABLET | Freq: Three times a day (TID) | ORAL | Status: AC | PRN
Start: 2013-07-30 — End: 2013-08-01

## 2013-07-30 MED ORDER — ACETAMINOPHEN 325 MG TABLET
650.0000 mg | ORAL_TABLET | Freq: Once | ORAL | Status: DC
Start: 2013-07-30 — End: 2013-07-30

## 2013-07-30 NOTE — Discharge Instructions (Signed)
You were seen for back pain.  Ultrasound, blood tests, and urine tests did not show any evidence of injury or infection.      If your pain becomes worse, or you experience fever, chills, pain with urination, or any other concerning signs, please return to the emergency department for evaluation.        Thank you for choosing [facility] for your emergency health care needs. It has been our privilege to take care of you today. Your primary complaints have been evaluated based on your history, lab tests, imaging tests and you have been treated for your symptoms and discharged home. Please take all medicines that are prescribed to you as directed (see below).  It is crucial, if you have a primary care physician, to follow up with him or her in the time frame recommended as many health conditions that seem self-limited initially may actually worsen over time.  If you do not have a primary care physician, we will outline the various resources available for you to find one.    If at any time you feel that your condition is worsening, call your doctor or return to the Emergency Department for reevaluation. CALL 911 IF YOU THINK YOU ARE HAVING A MEDICAL EMERGENCY. Return to the Emergency Department if you are unable to obtain the recommended follow-up treatment or you are not better as expected. You can call the Medical Center with questions at [phone].    Please realize that the results of some studies that you had done during your stay with [place] (such as x-rays and cultures) have only preliminarily results at this time.  Results of these studies may change as more information becomes available or as the studies are re-evaluated by other members of our health care team in the next few days. We will attempt to contact you with any important changes or additions to the studies that were obtained today, particularly if any of these results require a change in your treatment.

## 2013-07-30 NOTE — Progress Notes (Addendum)
Brief Progress Note:     Patient received from ER stating she was told at [facility] she is [redacted] weeks pregnant.  Patient is disheveled appearing but able to answer questions and A&Ox3.  Said she was seen at [facility] today and diagnosed with the pregnancy; initially presented for back pain.     Transvaginal ultrasound: no evidence of IUP.     A/P:   Kayla Perkins is a 32yr old female who presents from ED stating she is ~[redacted] weeks pregnant with no evidence of IUP.   Cannot definitively rule out very early pregnancy at this point; will need beta HCG and formal ultrasound.     Patient sent to the emergency room for further evaluation.     Alleen Borne, MD  PGY-2  Department of Obstetrics and Gynecology  Pager [phone]      Attending Addendum  This patient was seen and evaluated by the resident. I agree with the assessment and plan that we developed together as outlined in the resident's note.    Marthenia Rolling, MD  Assistant Clinical Professor  Obstetrics/Gynecology  PI# [phone] pager [phone]

## 2013-07-30 NOTE — ED Nursing Note (Signed)
rcvd report, pt A&O, c/o nausea & back pain

## 2013-07-30 NOTE — ED Triage Note (Signed)
Pt states she has low back pain that started last night today she has intense low back pain, reports being [redacted] weeks pregnant, denies bleeding or abdominal pain. To L&D.

## 2013-07-30 NOTE — Nurse Focus (Signed)
Pt returned to ER via wheelchair by ER tech "Matt". Pt has all belongings and accompanied by pts brother.    Lazarus Salines RN

## 2013-07-30 NOTE — ED Nursing Note (Signed)
Urine sent to lab 

## 2013-07-30 NOTE — ED Nursing Note (Signed)
Received patient from triage who came to ED due to increasing abdominal pain radiating up to her shoulder. Pt reported pain is aggravted with movement and ambulation. Good perpiheral pusles noted. AOx4. GCS 15. NAD VSS. Seen and evaluated by MD. Will monitor

## 2013-07-30 NOTE — Nurse Focus (Signed)
Spoke with ED Charge Nurse Elsa regarding pt being sent to L&D without confirming pregnancy. Pt confirmed by BS U/S by Dr Andrey Campanile with no IUP noted. ER staff to escort pt back to ED for initial c/o abdominal pain. Pt accompanied by 2 males.

## 2013-07-30 NOTE — ED Triage Note (Signed)
Pt sent back down from L&D s/p  confirmed negative UPT.  Pt con't to report back pain onset a couple days ago denies any extreneous activites/trauma. Denies numbness/tingling to extremities.

## 2013-07-30 NOTE — ED Nursing Note (Signed)
Seen and evaluated byb MD. Medicated for pain. Off to xray

## 2013-07-30 NOTE — ED Nursing Note (Signed)
Assisted patient to the bathroom via wheelchair,noted patient stand up and ambulated with mnimal difficulty observed, medicate for pain. Will monitor

## 2013-07-30 NOTE — ED Initial Note (Signed)
EMERGENCY DEPARTMENT PHYSICIAN NOTE - Kayla Perkins       Date of Service:   07/30/2013  2:39 AM Patient's PCP: No Pcp No Pcp   Note Started: 07/30/2013 03:23 DOB: 1981/04/17             Chief Complaint   Patient presents with    Back Pain Acute     The history provided by the patient and medical records.  Interpreter used: No    Kayla Perkins is a 32yr old female, with a past medical history significant for bipolar, schizophrenia, UTI, who presents to the ED with a chief complaint of back pain that began yesterday.  Of note, patient initially presented to ED triage stating she was [redacted] weeks pregnant, she was taken to L+D where she had negative u-preg, and was subsequently brought back to adult ED triage.  Currently patient states she has sharp 10/10 b/l flank pain which is continuous across her lower back.  She denies fever, chills, nausea, dysuria, pelvic pain, vaginal bleeding.  She has had similar sx previously - believes these were from "stones" and from kidney infections.       A full history, including pertinent past medical, family and social history was reviewed.    HISTORY:  There are no hospital problems to display for this patient.   No Known Allergies   Past Medical History:    NO SIGNIFICANT HISTORY                                     Past Surgical History:    No surgical history                                            Tonsillectomy                                                 Social History    Marital Status: MARRIED             Spouse Name:                       Years of Education:                 Number of children:               Occupational History    None on file    Social History Main Topics    Smoking Status: Current Every Day Smoker        Packs/Day: 0.00  Years:         Smokeless Status: Not on file                       Alcohol Use: Yes                Comment: "once in a while"    Drug Use: No              Sexual Activity: Not on file          Other Topics            Concern  None on  file    Social History Narrative    None on file     No family history on file.             Review of Systems   Constitutional: Negative for fever, chills, activity change and fatigue.   HENT: Negative for ear pain and neck pain.    Eyes: Negative for pain and visual disturbance.   Respiratory: Negative for cough, chest tightness and shortness of breath.    Cardiovascular: Negative for chest pain, palpitations and leg swelling.   Gastrointestinal: Negative for nausea, vomiting, abdominal pain, diarrhea, blood in stool and abdominal distention.   Endocrine: Negative.    Genitourinary: Positive for flank pain. Negative for dysuria, urgency, frequency, hematuria, vaginal bleeding, difficulty urinating, vaginal pain, menstrual problem and pelvic pain.   Musculoskeletal: Positive for back pain. Negative for myalgias.   Skin: Negative for color change and pallor.   Allergic/Immunologic: Negative.    Neurological: Negative for dizziness, syncope and headaches.   Hematological: Negative.    Psychiatric/Behavioral: Positive for confusion. Negative for agitation.   All other systems reviewed and are negative.        TRIAGE VITAL SIGNS:  Temp: 36.6 C (97.9 F) (07/30/13 0217)  Temp src: Oral (07/30/13 0217)  Pulse: 78 (07/30/13 0217)  BP: 117/73 mmHg (07/30/13 0217)  Resp: 16 (07/30/13 0217)  SpO2: 98 % (07/30/13 0217)  Weight: 73.1 kg (161 lb 2.5 oz) (07/30/13 0217)    Physical Exam   Nursing note and vitals reviewed.  Constitutional: She is oriented to person, place, and time. She appears well-developed and well-nourished.   HENT:   Head: Normocephalic and atraumatic.   Mouth/Throat: Oropharynx is clear and moist.   Eyes: EOM are normal. Pupils are equal, round, and reactive to light.   Neck: Normal range of motion. Neck supple. No thyromegaly present.   Cardiovascular: Normal rate, regular rhythm and normal heart sounds.    Pulmonary/Chest: Effort normal and breath sounds normal. No respiratory distress.   Abdominal: Soft.  Bowel sounds are normal. She exhibits no distension. There is no tenderness. There is no rebound.   CVT b/l.  No obvious bruising or mass.  TTP at L-4,5   Musculoskeletal: Normal range of motion. She exhibits no edema and no tenderness.   Neurological: She is alert and oriented to person, place, and time. She has normal reflexes. No cranial nerve deficit.   Skin: Skin is warm and dry. No rash noted. No erythema.   Psychiatric: She has a normal mood and affect.       INITIAL ASSESSMENT & PLAN, MEDICAL DECISION MAKING, ED COURSE:  Kayla Perkins is a 32yr old female who presents with a chief complaint of low back pain. After history and exam, I feel the diagnosis is most likely low back pain. Differential diagnosis includes, but is not limited to pyelonephritis, lumbar fracture, nephrolithiasis.  The patient is hemodynamically stable and will require pain medication and labs, imaging as an immediate intervention. Initial treatment and studies to evaluate this problem will include labs, imaging and analgesia as needed.     ED Course Update: The patient was observed in the ED. The results of the ED evaluation were notable for the following:    Pertinent lab results (reviewed and interpreted independently by me):   Labs Reviewed   CBC AUTO + REFLEX MANUAL DIFF - Abnormal; Notable for the following:     MCH 33.4 (*)     All other  components within normal limits   BASIC METABOLIC PANEL - Abnormal; Notable for the following:     GLUCOSE 108 (*)     All other components within normal limits   URINALYSIS AND CULTURE IF IND   PREGNANCY SPOT URINE (UPT)   URINALYSIS AND CULTURE IF IND     CBC and BMP are unremarkable. Upreg is negative,  Udip is negative    Pertinent imaging results (reviewed and interpreted independently by me):   L-spine plain films: Unremarkable  Renal Ultrasound: Normal renal ultrasound.    Pertinent medications: toradol, given for pain with marked improvement.     Chart Review: Prior CT scans of abd have  shown no nephrolithiasis in the past (last in July). Similar presentation with + uti but unclear etiology      Further MDM  Additional data reviewed? N/A      Patient Summary: Kayla Perkins is a 32yr old female with psychiatric illness and history of UTI presenting with complaint of low back pain.  Patient is of questionable reliability after initially claiming to be pregnant up arrival.  Her initial labs are notable for unremarkable CBC and BMP, negative urine preg. No n/v or abd pain so no LFTs and lipase sent.  Udip is unremarkable.  L-spine imaging showed no acute fracture, Renal US was normal.   The patient is stable for discharge home.  She has been given return precautions, has an appointment with her PCP on 11/4.      LAST VITAL SIGNS:  Temp: 36.6 C (97.9 F) (07/30/13 0217)  Temp src: Oral (07/30/13 0217)  Pulse: 78 (07/30/13 0217)  BP: 117/73 mmHg (07/30/13 0217)  Resp: 16 (07/30/13 0217)  SpO2: 98 % (07/30/13 0217)  Weight: 73.1 kg (161 lb 2.5 oz) (07/30/13 0217)      Clinical Impression: Back pain     Anticipated work up needed: PMD f/up    Disposition: Discharge. Follow up with PCP. ED discharge instructions were reviewed and provided. It was discussed that radiology reports are preliminary and the patient will be contacted for any changes in interpretation.    MDM COMPLEXITY  Overall Complexity of MDM is: moderate    PRESENT ON ADMISSION:  Are any of the following four conditions present or suspected on admission: decubitus ulcer, infection from an intravascular device, infection due to an indwelling catheter, surgical site infection or pneumonia? No.    PATIENT'S GENERAL CONDITION:  Good: Vital signs are stable and within normal limits. Patient is conscious and comfortable. Indicators are excellent.     Report electronically signed by:  Raynelle Fanning, MD Intern      This patient was seen, evaluated, and care plan was developed with the resident.  I agree with the findings and plan as outlined in our  combined note.      Oneita Jolly, MD

## 2013-07-30 NOTE — ED Nursing Note (Signed)
Pt ambulated from ED, gait steady

## 2013-07-31 ENCOUNTER — Emergency Department
Admission: EM | Admit: 2013-07-31 | Discharge: 2013-07-31 | Disposition: A | Discharge status: Home / Self Care | Payer: MEDICAID | Attending: Emergency Medicine | Admitting: Emergency Medicine

## 2013-07-31 DIAGNOSIS — N12 Tubulo-interstitial nephritis, not specified as acute or chronic: Secondary | ICD-10-CM | POA: Insufficient documentation

## 2013-07-31 DIAGNOSIS — M549 Dorsalgia, unspecified: Secondary | ICD-10-CM | POA: Insufficient documentation

## 2013-07-31 HISTORY — DX: Other chronic pain: G89.29

## 2013-07-31 HISTORY — DX: Attention-deficit hyperactivity disorder, unspecified type: F90.9

## 2013-07-31 HISTORY — DX: Bipolar disorder, unspecified: F31.9

## 2013-07-31 HISTORY — DX: Dorsalgia, unspecified: M54.9

## 2013-07-31 HISTORY — DX: Schizophrenia, unspecified: F20.9

## 2013-07-31 MED ORDER — LEVOFLOXACIN 750 MG TABLET
750.0000 mg | ORAL_TABLET | Freq: Every day | ORAL | Status: AC
Start: 2013-07-31 — End: 2013-08-05

## 2013-07-31 MED ORDER — LEVOFLOXACIN 750 MG TABLET
750.0000 mg | ORAL_TABLET | Freq: Once | ORAL | Status: AC
Start: 2013-07-31 — End: 2013-07-31
  Administered 2013-07-31: 750 mg via ORAL
  Filled 2013-07-31: qty 1

## 2013-07-31 MED ORDER — KETOROLAC 60 MG/2 ML INTRAMUSCULAR SOLUTION
60.0000 mg | Freq: Once | INTRAMUSCULAR | Status: AC
Start: 2013-07-31 — End: 2013-07-31
  Administered 2013-07-31: 60 mg via INTRAMUSCULAR
  Filled 2013-07-31: qty 1

## 2013-07-31 MED ORDER — HYDROCODONE 5 MG-ACETAMINOPHEN 325 MG TABLET
1.0000 | ORAL_TABLET | Freq: Once | ORAL | Status: AC
Start: 2013-07-31 — End: 2013-07-31
  Administered 2013-07-31: 1 via ORAL
  Filled 2013-07-31: qty 1

## 2013-07-31 MED ORDER — CYCLOBENZAPRINE 5 MG TABLET
5.0000 mg | ORAL_TABLET | Freq: Three times a day (TID) | ORAL | Status: DC | PRN
Start: 2013-07-31 — End: 2013-07-31

## 2013-07-31 MED ORDER — HYDROCODONE 5 MG-ACETAMINOPHEN 325 MG TABLET
1.0000 | ORAL_TABLET | ORAL | Status: AC | PRN
Start: 2013-07-31 — End: 2013-08-05

## 2013-07-31 MED ORDER — IBUPROFEN 600 MG TABLET
600.0000 mg | ORAL_TABLET | Freq: Four times a day (QID) | ORAL | Status: AC | PRN
Start: 2013-07-31 — End: 2013-08-30

## 2013-07-31 NOTE — ED Triage Note (Signed)
C/o increasing back pain since last night . States rolled off bed last night.  Hx of chronic back pain.   Ambulates well.  Denies any decreased csm to legs.  Now states pain is also in right mid back.

## 2013-07-31 NOTE — ED Initial Note (Signed)
EMERGENCY DEPARTMENT PHYSICIAN NOTE - Joycelyn Liska       Date of Service:   07/31/2013  1:16 PM Patient's PCP: No Pcp No Pcp   Note Started: 07/31/2013 14:05 DOB: [birth year]             Chief Complaint   Patient presents with    Back Pain Chronic           The history provided by the patient.  Interpreter used: No    Kayla Perkins is a 32yr old female, with a past medical history significant for chronic back pain, who presents to the ED with a chief complaint of back pain that began today when getting off the light rail. She came straight here.  Reports back is "hot on the inside" from R mid back to lumbar. Patient last name was Turrubiates last time she came to ED 2 days ago. PCP appointment November 4th (1 week). No trauma. LNMP 24 days ago.     Quality: shooting  Location: mid to lower back  Severity: 9/10  Time Course: constant  Progression: unchanged  Duration: today  Palliative factors: nothing makes it better.  Provocative factors: walking makes it worse.  Pertinent negatives: Patient denies any dysuria, chills, fevers, neck pain, neck stiffness, eye pain, photophobia, cough, SOB, CP, palpitations, abdominal pain, constipation, back pain, diarrhea, nausea, vomiting, no loss of bowel or bladder incontinence or any other complaints at this time.       A full history, including pertinent past medical, family and social history was reviewed.    HISTORY:  1    *Pyelonephritis     No Known Allergies   Past Medical History:    ADHD (attention deficit hyperactivity disorder)               Schizophrenia                                                 Chronic back pain                                             Bipolar 1 disorder                                         Past Surgical History:    Tonsillectomy and adenoidectomy                               Social History    Marital Status: SINGLE              Spouse Name:                       Years of Education:                 Number of children:                Occupational History    None on file    Social History Main Topics    Smoking Status: Never Smoker  Smokeless Status: Not on file                       Alcohol Use: No              Drug Use: Yes                Special: Marijuana    Sexual Activity: Not on file          Other Topics            Concern    None on file    Social History Narrative    None on file     No family history on file.              Review of Systems   Constitutional: Negative for fever and chills.   HENT: Negative for neck pain and neck stiffness.    Eyes: Negative for photophobia and pain.   Respiratory: Negative for cough and shortness of breath.    Cardiovascular: Negative for chest pain and palpitations.   Gastrointestinal: Negative for nausea, vomiting, abdominal pain, diarrhea and constipation.   Genitourinary: Positive for flank pain. Negative for dysuria and hematuria.   Musculoskeletal: Positive for back pain.   Skin: Negative.    Neurological: Negative for headaches.   Psychiatric/Behavioral: Negative.    All other systems reviewed and are negative.        TRIAGE VITAL SIGNS:  Temp: 36.4 C (97.5 F) (07/31/13 1251)  Temp src: Oral (07/31/13 1251)  Pulse: 56 (07/31/13 1251)  BP: 163/80 mmHg (07/31/13 1251)  Resp: 19 (07/31/13 1251)  SpO2: 98 % (07/31/13 1251)  Weight: 74.5 kg (164 lb 3.9 oz) (07/31/13 1251)    Physical Exam   Nursing note and vitals reviewed.  Constitutional: She is oriented to person, place, and time. She appears well-developed and well-nourished. No distress.   HENT:   Head: Normocephalic and atraumatic.   Mouth/Throat: Oropharynx is clear and moist.   Eyes: EOM are normal. Pupils are equal, round, and reactive to light.   Neck: Normal range of motion. Neck supple.   Cardiovascular: Normal rate, regular rhythm and intact distal pulses.    Pulmonary/Chest: Effort normal. No respiratory distress.   Abdominal: Soft. She exhibits no distension. There is no tenderness. There is no rebound and no  guarding.   Musculoskeletal: Normal range of motion. She exhibits no edema.   Back: no skin changes, swelling, or asymmetry. Paraspinal lower back TTP. R sided CVA TTP.   Neurological: She is alert and oriented to person, place, and time.   Skin: Skin is warm and dry. She is not diaphoretic.   Psychiatric: She has a normal mood and affect. Her behavior is normal.           INITIAL ASSESSMENT & PLAN, MEDICAL DECISION MAKING, ED COURSE:  Kayla Perkins is a 32yr old female who presents with a chief complaint of back pain. After history and exam, I feel the diagnosis is most likely muscle spasm. Differential diagnosis includes, but is not limited to back strain, pyelonephritis, nephrolithiasis.  The patient is hemodynamically stable and will require clinical monitoring as an immediate intervention. Initial treatment and studies to evaluate this problem will include serial exams and labs.         ED Course Update: The patient was observed in the ED. The results of the ED evaluation were notable for the following:    Pertinent lab results (reviewed and interpreted  independently by me):   Labs Reviewed   URINALYSIS AND CULTURE IF IND - Abnormal; Notable for the following:     NITRITE URINE POSITIVE (*)     LEUK. ESTERASE SMALL (*)     WBC 4 (*)     URINE CULTURE INDICATED (*)     All other components within normal limits   PREGNANCY SPOT URINE (UPT)   CULTURE URINE, BACTI       Pertinent medications:   Cyclobenzaprine (FLEXERIL) 5 mg tablet, given for muscle relaxant  Ibuprofen (MOTRIN) 600 mg Tablet given for pain  Ketorolac (TORADOL) Injection 60 mg given for pain  LevoFLOXacin (LEVAQUIN) Tablet 750 mg for pain        Further MDM  Additional data reviewed? N/A    ED Course:   14:05 Patient seen and evaluated  14:46 Patient given Toradol 60 mg IM  15:33 PE and UA c/w early pyelonephritis / UTI. Patient given Norco 5 PO and first dose of Levofloxacin 750 mg PO in ED prior to d/c.  15:40 Patient discharged, reports feeling  much better    Patient Summary: Kayla Perkins is a 32yr old female with back pain.  Labs remarkable for nitrite+ UA, along with R sided CVA tenderness upon physical exam suggestive of pyelonephritis. Patient afebrile and able to tolerate PO intake so outpatient treatment appropriate. Toradol IM given here for pain. Ibuprofen PO and levoquin give for outpatient therapy. Patient was d/c home in stable condition with return precautions, instruction for out patient follow up and Rx for Norco and Levofloxacin.  She expressed understanding and agreement with the d/c plan. Please see the ED/AVS for the details of d/c instructions.      LAST VITAL SIGNS:  Temp: 36.9 C (98.4 F) (07/31/13 1538)  Temp src: Oral (07/31/13 1538)  Pulse: 51 (07/31/13 1538)  BP: 114/75 mmHg (07/31/13 1538)  Resp: 16 (07/31/13 1538)  SpO2: 100 % (07/31/13 1538)  Weight: 74.5 kg (164 lb 3.9 oz) (07/31/13 1251)      Clinical Impression:  Pyelonephritis  Back pain    Anticipated work up needed: none      Disposition: Discharge. Follow up with PCP. ED discharge instructions were reviewed and provided. It was discussed that radiology reports are preliminary and the patient will be contacted for any changes in interpretation.        MDM COMPLEXITY  Overall Complexity of MDM is: low          PRESENT ON ADMISSION:  Are any of the following four conditions present or suspected on admission: decubitus ulcer, infection from an intravascular device, infection due to an indwelling catheter, surgical site infection or pneumonia? No.    PATIENT'S GENERAL CONDITION:  Good: Vital signs are stable and within normal limits. Patient is conscious and comfortable. Indicators are excellent.     DISCLAIMER:   This document serves as my personal record of services taken in my presence. It was created on 07/31/2013 on my behalf by Marcelle Overlie, a trained medical scribe.     I have reviewed this document and agree that this note accurately reflects the history and  exam findings, the patient care provided, and my medical decision making.    This patient was seen, evaluated, and the care plan was developed in conjunction with Julian Hy, MD - Resident Physician. I agree with the findings and plan as outlined in our combined note.      07/31/2013  Electronically Signed By: Valarie Cones,  MD  Associate Professor, Department of Emergency Medicine  Attending Physician, [facility]

## 2013-07-31 NOTE — ED Nursing Note (Signed)
Pt discharged by resident, pt feeling much better, pt ambulated out of the dept with no difficulty.

## 2013-09-10 ENCOUNTER — Emergency Department
Admission: EM | Admit: 2013-09-10 | Discharge: 2013-09-10 | Disposition: A | Discharge status: Home / Self Care | Payer: MEDICAID | Attending: Emergency Medicine | Admitting: Emergency Medicine

## 2013-09-10 ENCOUNTER — Emergency Department (EMERGENCY_DEPARTMENT_HOSPITAL): Payer: MEDICAID

## 2013-09-10 DIAGNOSIS — R200 Anesthesia of skin: Secondary | ICD-10-CM | POA: Insufficient documentation

## 2013-09-10 DIAGNOSIS — M25579 Pain in unspecified ankle and joints of unspecified foot: Secondary | ICD-10-CM | POA: Insufficient documentation

## 2013-09-10 DIAGNOSIS — F209 Schizophrenia, unspecified: Secondary | ICD-10-CM | POA: Insufficient documentation

## 2013-09-10 DIAGNOSIS — R209 Unspecified disturbances of skin sensation: Secondary | ICD-10-CM | POA: Insufficient documentation

## 2013-09-10 DIAGNOSIS — R109 Unspecified abdominal pain: Secondary | ICD-10-CM | POA: Insufficient documentation

## 2013-09-10 DIAGNOSIS — M25519 Pain in unspecified shoulder: Secondary | ICD-10-CM | POA: Insufficient documentation

## 2013-09-10 DIAGNOSIS — R079 Chest pain, unspecified: Secondary | ICD-10-CM | POA: Insufficient documentation

## 2013-09-10 MED ORDER — POTASSIUM BICARBONATE-CITRIC ACID 25 MEQ EFFERVESCENT TABLET
50.0000 meq | EFFERVESCENT_TABLET | Freq: Once | ORAL | Status: AC
Start: 2013-09-10 — End: 2013-09-10
  Administered 2013-09-10: 50 meq via ORAL
  Filled 2013-09-10: qty 2

## 2013-09-10 MED ORDER — LORAZEPAM 1 MG TABLET
0.5000 mg | ORAL_TABLET | Freq: Once | ORAL | Status: AC
Start: 2013-09-10 — End: 2013-09-10
  Administered 2013-09-10: 0.5 mg via ORAL
  Filled 2013-09-10: qty 1

## 2013-09-10 MED ORDER — HYDROMORPHONE 1 MG/ML INJECTION SYRINGE
0.4000 mg | INJECTION | Freq: Once | INTRAMUSCULAR | Status: AC
Start: 2013-09-10 — End: 2013-09-10
  Administered 2013-09-10: 0.4 mg via INTRAVENOUS
  Filled 2013-09-10: qty 1

## 2013-09-10 MED ORDER — LORAZEPAM 2 MG/ML INJECTION SOLUTION
0.5000 mg | Freq: Once | INTRAMUSCULAR | Status: AC
Start: 2013-09-10 — End: 2013-09-10
  Administered 2013-09-10: 0.5 mg via INTRAVENOUS
  Filled 2013-09-10: qty 1

## 2013-09-10 NOTE — ED Nursing Note (Signed)
Pt provided with dc instructions and verbalized understanding of them. Pt requesting narcotic prescription. Discussed this with MD and patient to take over the counter Tylenol or motrin as noted in dc instructions. Pt informed of this and verbalized understanding. Pt ambulated out of department with steady, upright gait with significant other. NAD on dc, GCS 15.

## 2013-09-10 NOTE — ED Initial Note (Signed)
EMERGENCY DEPARTMENT PHYSICIAN NOTE - Kayla Perkins       Date of Service:   09/10/2013  4:50 PM Patient's PCP: No Pcp No Pcp   Note Started: 09/10/2013 16:58 DOB: 1981-01-15             Chief Complaint   Patient presents with    Abd Pain Active           The history provided by the patient.  Interpreter used: No    Bernadette Gores is a 32yr old female, with a past medical history significant for schizophrenia, who presents to the ED with a chief complaint of bilateral upper quadrant abdominal pain that began hours ago this morning. Per patient she feels like her "liver and kidneys are shutting down". Abdominal pain is pinching and burning in nature and rated at 9/10. She had one episode of NBNB emesis. Normal brown BM minutes ago. No diarrhea or constipation. Her pain is not exacerbated with eating, is improved with lying down. While she was sitting in the waiting room she developed onset of numbness and tingling to RUE that spread to her RLE.  Notes she recently ran into a wall today injuring her right shoulder. She has no weakness, no facial droop, slurred speech, unstable gait. No vision changes, headache.     Quality: pinching and burning  Location: bilateral upper quadrants  Severity: 9/10  Time Course: worsening  Progression: gradually worsening  Duration: 1 day  Palliative factors: laying down makes it better.  Provocative factors: sitting up makes it worse.  Associated symptoms: abdominal pain, RUE and RLE numbness and tingling  Pertinent negatives: diarrhea, constipation, HA, vision changes, leg swelling, hematuria, vaginal bleeding, blood in stool, dysuria.    A full history, including pertinent past medical, family and social history was reviewed.    HISTORY:  There are no hospital problems to display for this patient.   No Known Allergies   Past Medical History:    NO SIGNIFICANT HISTORY                                        ADHD (attention deficit hyperactivity disorder)               Schizophrenia                                                  Chronic back pain                                             Bipolar 1 disorder                                         Past Surgical History:    No surgical history                                            Tonsillectomy  Tonsillectomy and adenoidectomy                               Social History    Marital Status: SEPARATED           Spouse Name:                       Years of Education:                 Number of children:               Occupational History    None on file    Social History Main Topics    Smoking Status: Never Smoker                      Smokeless Status: Not on file                       Alcohol Use: No                 Comment: "once in a while"    Drug Use: Yes                Special: Marijuana    Sexual Activity: Not on file          Other Topics            Concern    None on file    Social History Narrative    ** Merged History Encounter **          No family history on file.             Review of Systems   Constitutional: Negative for fever and chills.   HENT: Negative for congestion, rhinorrhea, sinus pressure and sore throat.    Eyes: Negative for visual disturbance.   Respiratory: Negative for shortness of breath.    Gastrointestinal: Positive for nausea, vomiting and abdominal pain. Negative for diarrhea, constipation and blood in stool.   Genitourinary: Negative for dysuria and hematuria.   Musculoskeletal: Negative for back pain and myalgias.        Right shoulder pain   Skin: Negative for color change and rash.   Allergic/Immunologic: Negative for immunocompromised state.   Neurological: Positive for numbness (right arm and leg). Negative for tremors and syncope.   Hematological: Does not bruise/bleed easily.   Psychiatric/Behavioral: Negative for confusion.   All other systems reviewed and are negative.        TRIAGE VITAL SIGNS:  Temp: 37 C (98.6 F) (09/10/13 1116)  Temp src: Oral (09/10/13  1116)  Pulse: 59 (09/10/13 1116)  BP: 129/83 mmHg (09/10/13 1116)  Resp: 14 (09/10/13 1116)  SpO2: 99 % (09/10/13 1320)  Weight: 72 kg (158 lb 11.7 oz) (09/10/13 1643)    Physical Exam   Nursing note and vitals reviewed.  Constitutional: She is oriented to person, place, and time. She appears well-developed and well-nourished. No distress.   Pt tearful during physical exam.   HENT:   Head: Normocephalic and atraumatic.   Eyes: Conjunctivae and EOM are normal. Pupils are equal, round, and reactive to light.   Neck: Normal range of motion. Neck supple.   No carotid bruits   Cardiovascular: Normal rate, regular rhythm, normal heart sounds and intact distal pulses.  Exam reveals no gallop and no friction rub.  No murmur heard.  Pulmonary/Chest: Effort normal and breath sounds normal. No respiratory distress. She has no wheezes. She has no rales.   Abdominal: Soft. She exhibits no distension. There is no tenderness. There is no rebound and no guarding.   Musculoskeletal:       Neurological: She is alert and oriented to person, place, and time. No cranial nerve deficit.   Reflex Scores:       Bicep reflexes are 2+ on the right side and 2+ on the left side.       Patellar reflexes are 3+ on the right side and 3+ on the left side.  Normal finger to nose. Normal cerebellar functioning. No pronator drift. Interosseous muscles 5/5 to bilateral upper extremities. 4/5 strength to right bicep, triceps but limited by pain. 5/5 strength to bilateral deltoids. 5/5 strength to proximal and distal muscle groups of LUE. 5/5 bilateral hip and knee flexion/extenion, plantar and dorsiflexion. Sensation intact to entire right hand, decreased sensation to light touch from right wrist to right shoulder however drew away from noxious stimuli. Subjective decreased sensation located to patch at right lateral hip.   Skin: Skin is warm and dry. She is not diaphoretic.   Psychiatric: She has a normal mood and affect. Her behavior is normal.      Attending exam:  Agree with above except full biceps 5/5 strength, some give-way weakness.  No pronator drift upper or lower extremities.  Mental status appears appropriate.  Language normal with no hesitation in word-finding, normal speech.  Patient at times says she has "shooting pains" with touch in her areas of "numbness and tingling" and at times says "I cannot feel that."  Areas noted above.  No signs of ischemia.  No neck bruit or heart murmur, cardiac regular with normal rate.  Noted to be laughing and talking with husband when physicians not present.  Does not appear to be responding to internal stimuli, cooperative with exam and HPI.      INITIAL ASSESSMENT & PLAN, MEDICAL DECISION MAKING, ED COURSE:  Latrece Nitta is a 32yr old female who presents with a chief complaint of burning pinching abdominal pain with subjective RUE and RLE numbness/tingling. After history and exam, I feel the diagnosis is most likely gastritis. Differential diagnosis includes, but is not limited to  hepatitis, cholecystis, pancreatitis, psychiatric d/o such as conversion disorder. The differential for her R-sided numbness/tingling is fracture, peripheral neuropathy, CVA/TIA, carotid dissection although the "positive" symptoms of tingling and skin hyperparesthesias with touch argue against ischemic or hemorrhagic stroke in the brain.  The patient is hemodynamically stable and will require monitoring as an immediate intervention. Initial treatment and studies to evaluate this problem will include serial exams, labs, imaging and EKG.         ED Course Update: The patient was observed in the ED. The results of the ED evaluation were notable for the following:    Pertinent lab results (reviewed and interpreted independently by me):   Labs Reviewed   URINALYSIS-COMPLETE - Abnormal; Notable for the following:     KETONES Trace (*)     MUCOUS/LPF MODERATE (*)     All other components within normal limits   BASIC METABOLIC PANEL -  Abnormal; Notable for the following:     POTASSIUM 3.1 (*)     CHLORIDE 113 (*)     CARBON DIOXIDE TOTAL 21 (*)     UREA NITROGEN, BLOOD (BUN) 7 (*)     CALCIUM 8.0 (*)  All other components within normal limits   HEPATIC FUNCTION PANEL - Abnormal; Notable for the following:     PROTEIN 5.9 (*)     ALBUMIN 3.2 (*)     ASPARTATE TRANSAMINASE (AST) 14 (*)     All other components within normal limits   CBC AUTO + REFLEX MANUAL DIFF   LIPASE   POC PREGNANCY SPOT URINE (UPT)      Pertinent imaging results (reviewed and interpreted independently by me):   SHOULDER 2+ VIEWS, RIGHT: "Internal rotation, external rotation, scapular views right shoulder. No acute fracture or dislocation"    CT HEAD WITHOUT CONTRAST : prelim read " No acute intracranial process. Mastoid air cells and paranasal sinuses are  clear. Globes are intact. No retrobulbar hematoma "    EKG (reviewed and interpreted independently by me): The rhythm is sinus, rate 48, axis nl, no ST/T changes    Pertinent medications: 0.5 mg Ativan po and then IV, given for anxiety. 0.4 mg Dilaudid, given for pain. K-lyte x1    Further MDM  Additional data reviewed? N/A    Interventions: Dilaudid, Ativan.    ED Course:   16:58 Patient was seen and evaluated.    17:30 Ativan and Dilaudid administered.   18:32 Reported numbness now in her feet shooting up her leg    Patient Summary:   Adelin Ventrella is a 32yr old female with hx of schizophrenia who presents with RUQ and LUQ abdominal, chest, shoulder, ankle pain along with R-sided numbness and tingling. Her vitals were stable and had benign abdomen, mild RUQ tenderness but when distracted did not grimace with deep palpation. She has subjective decreased sensation from her wrist to her shoulder but withdrew with IVs placed. Also had numbness of her foot. None of which were in a dermatomal pattern and highly unlikely distribution for ischemic CVA.  Doubt multiple sclerosis as she has not had these neurologic findings  in past, however if they continue would recommend outpatient MRI. CT head was obtained and negative for hemorraghic CVA. Shoulder x-ray also obtained given her hx of mild trauma and was negative. She had improvement in her numbness/tingling with Dilaudid (again arguing against neurologic catastrophe). Labs were unrevealing other than low potassium which was replaced. Given the pain involving multiple body regions, sensation changes, and improvement with pain medications suspected conversion d/o. Discussed return precautions regarding her pain and numbness/tingling and patient agreeable to discharge from ED.     Patient improved, able to ambulate easily out of ED, comfortable with plan to f/u with psychiatry and come back if worse.  Abdominal pain felt not to warrant further imaging due to easily controlled, distractible on exam, and lack of lab concerns.      LAST VITAL SIGNS:  Temp: 36.8 C (98.2 F) (09/10/13 1638)  Temp src: Oral (09/10/13 1638)  Pulse: 71 (09/10/13 1638)  BP: 104/74 mmHg (09/10/13 1638)  Resp: 16 (09/10/13 1638)  SpO2: 100 % (09/10/13 1638)  Weight: 72 kg (158 lb 11.7 oz) (09/10/13 1643)      Clinical Impression:   Abdominal, shoulder, chest and ankle pain  Numbness/tingling  Schizophrenia      Anticipated work up needed: none      Disposition: Discharge. Follow up with PCP. ED discharge instructions were reviewed and provided. It was discussed that radiology reports are preliminary and the patient will be contacted for any changes in interpretation.        MDM COMPLEXITY  Overall Complexity of MDM is:  High          PRESENT ON ADMISSION:  Are any of the following four conditions present or suspected on admission: decubitus ulcer, infection from an intravascular device, infection due to an indwelling catheter, surgical site infection or pneumonia? No.    PATIENT'S GENERAL CONDITION:  Good: Vital signs are stable and within normal limits. Patient is conscious and comfortable. Indicators are  excellent.     DISCLAIMER:   This document serves as my personal record of services taken in my presence. It was created on 09/10/2013 on my behalf by Ansel Bong, a trained medical scribe.     I have reviewed this document and agree that this note accurately reflects the history and exam findings, the patient care provided, and my medical decision making.    This patient was seen, evaluated, and the care plan was developed in conjunction with Dareen Piano, MD - Resident Physician. I agree with the findings and plan as outlined in our combined note.      09/10/2013  Electronically Signed By: Steva Colder, MD  Attending Physician, Department of Emergency Medicine  [facility]

## 2013-09-10 NOTE — ED Progress Note (Signed)
18:32  Patient reevaluated as nursing noted she is now complaining of numbness to her foot.  Her husband states, "I moved her ankle and is causes shooting pains all the way up her leg and she is now numb in the foot."  She states, "Its most of my leg."  On exam she has LT sensation "differences" right leg to mid thigh, spares abdomen, but it causes shooting pain all the way up her leg.  She is motor intact.      I will add on a CT head to evaluate for any intracranial abnormalities but I am considering more highly conversion disorder in this patient as she looks comfortable, is actively conversing with her husband laughing, has no objective evidence of focal neurologic deficit, and has multisystem complaints (neurologic, abdominal, musculoskeletal, chest pain).  I have elected not to pursue acute stroke activation for the above reasons and her distribution involving leg and arm do not correlate with a specific pattern (pure sensory stroke I would expect truncal involvement as well).      Steva Colder  PI # [id number]

## 2013-09-10 NOTE — ED Triage Note (Signed)
Pt states took preg test at home , is was neg.

## 2013-09-10 NOTE — ED Nursing Note (Signed)
C/o ap to "liver", c/o some lower ap, pt states she has "skin coming from my uterus", c/o some numbness to rue from r posterior back down arm and to hand, pt states the numbness is on the radial aspect of the arm, pt w/ clear speech, no facial droop, gcs 15, pt states numbness started 2 minutes ago, =grips, to resus

## 2013-09-10 NOTE — ED Triage Note (Signed)
Pt c/o abd pain this am states noticed  Blood. C/o with n/v/ has not had a period in 2 mos,

## 2013-09-10 NOTE — ED Nursing Note (Signed)
BMP, HFP, lipase reported hemolyzed. New order placed for redraw

## 2013-09-10 NOTE — ED Nursing Note (Signed)
Anxious, crying intermittently, says she just vomited a few mins ago and had normal BM while in the bathroom at that time. Says she then felt her right arm going numb but has normal strength and movement and her rt flank felt numb and rt leg. Ambulatory with steady gait. Says she feels like her kidneys and liver may be shutting down and then began to have chest pain while in waiting room.  Has husband at bedside.

## 2013-09-10 NOTE — ED Nursing Note (Signed)
Dc instructions given, pt denies pain

## 2013-09-10 NOTE — ED Nursing Note (Signed)
No acute changes; VSS

## 2013-09-10 NOTE — ED Nursing Note (Signed)
Pt to CT at this time.

## 2013-09-11 ENCOUNTER — Emergency Department
Admission: EM | Admit: 2013-09-11 | Discharge: 2013-09-12 | Disposition: A | Discharge status: Home / Self Care | Payer: MEDICAID | Attending: Emergency Medicine | Admitting: Emergency Medicine

## 2013-09-11 DIAGNOSIS — R5383 Other fatigue: Secondary | ICD-10-CM | POA: Insufficient documentation

## 2013-09-11 DIAGNOSIS — R209 Unspecified disturbances of skin sensation: Secondary | ICD-10-CM | POA: Insufficient documentation

## 2013-09-11 DIAGNOSIS — R109 Unspecified abdominal pain: Secondary | ICD-10-CM | POA: Insufficient documentation

## 2013-09-11 DIAGNOSIS — R5381 Other malaise: Secondary | ICD-10-CM | POA: Insufficient documentation

## 2013-09-11 DIAGNOSIS — M549 Dorsalgia, unspecified: Secondary | ICD-10-CM | POA: Insufficient documentation

## 2013-09-11 DIAGNOSIS — F319 Bipolar disorder, unspecified: Secondary | ICD-10-CM | POA: Insufficient documentation

## 2013-09-11 DIAGNOSIS — N289 Disorder of kidney and ureter, unspecified: Secondary | ICD-10-CM | POA: Insufficient documentation

## 2013-09-11 DIAGNOSIS — F209 Schizophrenia, unspecified: Secondary | ICD-10-CM | POA: Insufficient documentation

## 2013-09-11 DIAGNOSIS — R200 Anesthesia of skin: Secondary | ICD-10-CM | POA: Insufficient documentation

## 2013-09-11 NOTE — ED Triage Note (Signed)
Pt ambulated to triage, arrives via EMS> Pt with chronic lower back pain, had GLF this am, now with worsening lower back pain. Ambulated with steady gait. NAD. Skin PWD

## 2013-09-11 NOTE — ED Initial Note (Signed)
EMERGENCY DEPARTMENT PHYSICIAN NOTE - Lacretia Tindall       Date of Service:   09/11/2013 10:16 AM Patient's PCP: No Pcp No Pcp   Note Started: 09/11/2013 12:15 DOB: 1981/09/02             Chief Complaint   Patient presents with    Back Pain Chronic           The history provided by the patient.  Interpreter used: No    Kayla Perkins is a 32yr old female, with a past medical history significant for schizophrenia, who presents to the ED with a chief complaint of back pain that began > 3 days ago. Patient was in ED last night. Onset of R sided numbness, tingling, "kidney pain", "bubbles in my heart", no chest pain or shortness of breath at this time, back pain.  The patient denies shoulder pain compared to last ED visit, but states felt numbness and fell in shower yesterday, did not hit head, but fell on back and re-exacerbated pain.   No hematuria.  No urinary/bowel symptoms or dysfunction.  Denies drugs of abuse, denies IVDA or fevers/chills.  No midline back pain.      Quality: numbness, tingling  Location: R upper and lower extremities  Time Course: constant  Progression: unchanged  Duration: hours  Palliative factors: relaxation makes it better.  Provocative factors: movement, standing, walking makes it worse.      The patient was seen by the PIT doc, I evaluated the patient at 6AM on 09/12/13    A full history, including pertinent past medical, family and social history was reviewed.    HISTORY:  There are no hospital problems to display for this patient.   No Known Allergies   Past Medical History:    NO SIGNIFICANT HISTORY                                        ADHD (attention deficit hyperactivity disorder)               Schizophrenia                                                 Chronic back pain                                             Bipolar 1 disorder                                         Past Surgical History:    No surgical history                                            Tonsillectomy  Tonsillectomy and adenoidectomy                               Social History    Marital Status: SEPARATED           Spouse Name:                       Years of Education:                 Number of children:               Occupational History    None on file    Social History Main Topics    Smoking Status: Current Every Day Smoker        Packs/Day: 0.00  Years:         Smokeless Status: Not on file                       Alcohol Use: No                 Comment: "once in a while"    Drug Use: Yes                Special: Marijuana    Sexual Activity: Not on file          Other Topics            Concern    None on file    Social History Narrative    ** Merged History Encounter **          No family history on file.             Review of Systems   Constitutional: Negative for fever and chills.   HENT: Negative for facial swelling and sore throat.    Eyes: Negative for photophobia and redness.   Respiratory: Negative for chest tightness and shortness of breath.    Cardiovascular: Negative for chest pain, palpitations and leg swelling.   Gastrointestinal: Positive for abdominal pain. Negative for nausea, vomiting, diarrhea, blood in stool, abdominal distention and anal bleeding.        Bilateral upper quadrant abdominal pain   Genitourinary: Negative for dysuria, frequency, hematuria, vaginal bleeding and vaginal discharge.   Musculoskeletal: Positive for back pain. Negative for arthralgias, gait problem, joint swelling, myalgias, neck pain and neck stiffness.        "kidney pain"   Skin: Negative for rash and wound.   Neurological: Positive for numbness. Negative for dizziness, tremors, seizures, syncope, facial asymmetry, speech difficulty, weakness, light-headedness and headaches.        Decreased sensation on R upper and lower extremity  Numbness, tingling and weakness on R upper and lower extremity   Psychiatric/Behavioral: Negative for suicidal ideas and self-injury.   All other  systems reviewed and are negative.        TRIAGE VITAL SIGNS:  Temp: 36.4 C (97.5 F) (09/11/13 1014)  Temp src: Oral (09/11/13 1014)  Pulse: 105 (09/11/13 1014)  BP: 126/84 mmHg (09/11/13 1014)  Resp: 18 (09/11/13 1014)  SpO2: 99 % (09/11/13 1014)  Weight: 73.5 kg (162 lb 0.6 oz) (09/11/13 1014)    Physical Exam   Nursing note and vitals reviewed.  Constitutional: She is oriented to person, place, and time. She appears well-developed and well-nourished. No distress.   Poor hygiene   HENT:   Head:  Normocephalic and atraumatic.   Nose: Nose normal.   Mouth/Throat: Oropharynx is clear and moist.   Eyes: Conjunctivae and EOM are normal. Pupils are equal, round, and reactive to light. Right eye exhibits no discharge. Left eye exhibits no discharge. No scleral icterus.   Neck: Normal range of motion. Neck supple. No JVD present. No tracheal deviation present. No thyromegaly present.   Cardiovascular: Normal rate, regular rhythm, normal heart sounds and intact distal pulses.  Exam reveals no gallop and no friction rub.    No murmur heard.  Pulmonary/Chest: Effort normal and breath sounds normal. No stridor. No respiratory distress. She has no wheezes. She has no rales. She exhibits no tenderness.   Abdominal: Soft. She exhibits no distension and no mass. There is tenderness (mild TTP LUQ). There is no rebound and no guarding.   Musculoskeletal: Normal range of motion. She exhibits no edema and no tenderness.   Lymphadenopathy:     She has no cervical adenopathy.   Neurological: She is alert and oriented to person, place, and time. She has normal strength. She displays no atrophy and no tremor. A sensory deficit (patient reports subjective lack of sensation from right forearm to shoulder, right foot to right hip.  Right hand sensation normal.  ) is present. No cranial nerve deficit. She exhibits normal muscle tone. She displays no seizure activity. Gait normal. GCS eye subscore is 4. GCS verbal subscore is 5. GCS motor  subscore is 6.   Skin: Skin is warm and dry. No rash noted. She is not diaphoretic. No erythema. No pallor.   Psychiatric: Her behavior is normal. Her mood appears anxious. Her affect is blunt. Her speech is tangential. Her speech is not rapid and/or pressured, not delayed and not slurred. Cognition and memory are normal. She is communicative.   Loose associations           INITIAL ASSESSMENT & PLAN, MEDICAL DECISION MAKING, ED COURSE:  Kayla Perkins is a 32yr old female who presents with a chief complaint of back pain unchanged for more than 3 days, unclear inciting event, negative work up in ED 2 days ago.  Also with c/c right sided numbness, with negative CTH 2 days ago.  Neurologic exam is not consistent with CVA as sensory deficit spares the right hand, without evidence of any other neurologic findings.  No evidence of red flags for acute spinal injury, cauda equina.  Abdominal exam unchanged from 2 days ago, soft and benign with multiple repeat VS checks over the night prior to my evaluation represents a long period of stable observation.   Medical Decision making and Differential Diagnosis:    Doubt AAA or dissection because of relatively young age, no pulsatile masses or bruits, equal LE pulses, with no hx of vascular disease.  Doubt discitis, osteomyelitis, epidural abscess without fevers, no hx of IVDA or recent instrumentation, no neurologic symptoms or significant midline tenderness.  Doubt mass or fracture with hx of trauma, malignancy, or prostate disease.  Doubt spinal stenosis or cauda equina with no neuro symptoms, equal strengths, normal rectal tone, no evidence of bowel or bladder incontinence of retention. Doubt pyelonephritis, urinary stone with obstruction without evidence of infection or microscopic blood on urineanalysis and character, consistency of pain.  Doubt pancreatitis with location of pain and no hx of etoh abuse, rheumatologic dz, no biliary dz or recent viral illness. Doubt perforated  viscus, ulcer disease.  Doubt zoster.  The patient's presentation is most consistent with msk pain, spasm  or strain.  Possibly disc dz or arthropathy, but no findings to indicate need for emergent imaging.    The patient has a prior imaging history of CTH on 09/10/13, CT abd/pelvis on 04/20/13 without acute findgins.     No hx of EGD.    Counseled and explained regarding back exercises and stretches. Recommended PCP follow up, possible referral for physical therapy and pain management.      The patient has no PMD and acknowledges she needs a PMD for follow up, may need outpatient EGD, MRI.        ED Course Update: The patient was observed in the ED. The results of the ED evaluation were notable for the following:    Pertinent lab results (reviewed and interpreted independently by me): labs unremarkable, likely dehydration based on urine      Interventions: analgesia, muscle relaxant    ED Course: uncomplicated  08:04  Back pain resolved, asks to use bathroom, ambulated, tolerated PO.    830AM  Patient sleeping comfortably, smiling, requests discharge.  States numbness unchanged.      Patient Summary: 32 yo F with hx of schizophrenia who presents for persistent back pain, right sided parasthesias.  Neurologic exam not consistent with central neurologic etiology.  Back pain resolved with position, NSAID, muscle relaxant.  Back pain likely MSK.  Unclear etiology of numbness.  Discussed follow up plan and return precautions.           LAST VITAL SIGNS:  Temp: 36.4 C (97.5 F) (09/11/13 1014)  Temp src: Oral (09/11/13 1014)  Pulse: 105 (09/11/13 1014)  BP: 126/84 mmHg (09/11/13 1014)  Resp: 18 (09/11/13 1014)  SpO2: 99 % (09/11/13 1014)  Weight: 73.5 kg (162 lb 0.6 oz) (09/11/13 1014)      Clinical Impression: Back pain  Numbness      Anticipated work up needed: PMD      Disposition: Discharge. Follow up with PMD. ED discharge instructions were reviewed and provided. It was discussed that radiology reports are preliminary  and the patient will be contacted for any changes in interpretation.    I personally discussed the discharge instructions with the patient, need for followup, and return precautions the patient stated he/she understood and would comply.      MDM COMPLEXITY  Overall Complexity of MDM is: moderate          PRESENT ON ADMISSION:  Are any of the following four conditions present or suspected on admission: decubitus ulcer, infection from an intravascular device, infection due to an indwelling catheter, surgical site infection or pneumonia? No.    PATIENT'S GENERAL CONDITION:  Fair: Vital signs are stable and within normal limits. Patient is conscious but may be uncomfortable. Indicators are favorable.      Rudean Hitt DO  Attending Physician/Ultrasound Fellow  [facility]

## 2013-09-11 NOTE — ED Nursing Note (Signed)
No answer from WR when called for reassessment.

## 2013-09-11 NOTE — ED Nursing Note (Signed)
Pt ambulated to up front care from internal. BIBA, reports by EMS pt was ambulating on scene. Reports GLF, no loc. Pt asked to change into hospital gown.

## 2013-09-11 NOTE — ED Nursing Note (Signed)
Per internal pt for now needs to wait in [facility] till bed open in other area. Pt is moving steadily, equally, no facial droop and talking in complete sentences. Pt placed in w/c till then.

## 2013-09-11 NOTE — ED Nursing Note (Signed)
Pt crying, reports RLE gave out on her today while trying to get ready to show and fell on her left side on side of tab. C/o Left flank pain, tender to touch, no bruising noted. Per pt still feels numbness to R leg. Moving extremities. Lungs cta. Denies loc.

## 2013-09-11 NOTE — ED Nursing Note (Signed)
Per Dr. Leeroy Cha would like pt move to the main department. Pt now c/o multiple problems. Will notify charge RN.

## 2013-09-11 NOTE — ED Nursing Note (Addendum)
Pt here for reeval, c/o epigastric "gas pains" and states her "heart is hurting when the bubbles burst". EKG done in triage at this time.  Upon further exam, pt states was seen here yesterday and discharged for c/o numbness to R side of body suddenly. Pt was discharged and told to return if sx worsened. States they did and she had fall onto edge of bathroom this am @ 0700. Returned to ED at this time for same. No hematoma or bruising noted on exam, pain to lateral left chest wall with palpation.  EKG done. Pt upgraded to 933.

## 2013-09-11 NOTE — ED Nursing Note (Signed)
Pt to triage in [facility], still c/o back pain, states she feels tingling in right arm, "it's about to go out on me." Pt's female friend initially speaking poorly to RN but calms down when spoken with.

## 2013-09-12 ENCOUNTER — Other Ambulatory Visit: Payer: Self-pay

## 2013-09-12 DIAGNOSIS — R209 Unspecified disturbances of skin sensation: Secondary | ICD-10-CM

## 2013-09-12 DIAGNOSIS — R200 Anesthesia of skin: Secondary | ICD-10-CM | POA: Insufficient documentation

## 2013-09-12 DIAGNOSIS — R9431 Abnormal electrocardiogram [ECG] [EKG]: Secondary | ICD-10-CM

## 2013-09-12 DIAGNOSIS — M549 Dorsalgia, unspecified: Secondary | ICD-10-CM | POA: Insufficient documentation

## 2013-09-12 DIAGNOSIS — F319 Bipolar disorder, unspecified: Secondary | ICD-10-CM

## 2013-09-12 DIAGNOSIS — F209 Schizophrenia, unspecified: Secondary | ICD-10-CM

## 2013-09-12 DIAGNOSIS — I498 Other specified cardiac arrhythmias: Secondary | ICD-10-CM

## 2013-09-12 DIAGNOSIS — N289 Disorder of kidney and ureter, unspecified: Secondary | ICD-10-CM

## 2013-09-12 DIAGNOSIS — R5381 Other malaise: Secondary | ICD-10-CM

## 2013-09-12 DIAGNOSIS — R109 Unspecified abdominal pain: Secondary | ICD-10-CM

## 2013-09-12 DIAGNOSIS — I499 Cardiac arrhythmia, unspecified: Secondary | ICD-10-CM

## 2013-09-12 MED ORDER — DIAZEPAM 5 MG TABLET
5.0000 mg | ORAL_TABLET | Freq: Once | ORAL | Status: AC
Start: 2013-09-12 — End: 2013-09-12
  Administered 2013-09-12: 5 mg via ORAL
  Filled 2013-09-12: qty 1

## 2013-09-12 MED ORDER — CYCLOBENZAPRINE 5 MG TABLET
5.0000 mg | ORAL_TABLET | Freq: Three times a day (TID) | ORAL | 0 refills | Status: AC | PRN
Start: 2013-09-12 — End: 2013-09-16
  Filled 2013-09-12: qty 6, 2d supply, fill #0

## 2013-09-12 MED ORDER — KETOROLAC 30 MG/ML (1 ML) INJECTION SOLUTION
30.0000 mg | Freq: Once | INTRAMUSCULAR | Status: AC
Start: 2013-09-12 — End: 2013-09-12
  Administered 2013-09-12: 30 mg via INTRAVENOUS
  Filled 2013-09-12: qty 1

## 2013-09-12 NOTE — ED Nursing Note (Signed)
No significant changes to condition, VSS, con't to wait to see MD.

## 2013-09-12 NOTE — ED Nursing Note (Signed)
Discharge for home alert iv dc'd iv. Gait steady has belongings.

## 2013-09-12 NOTE — ED Nursing Note (Signed)
CARE ASSUMED OF PT. PT AMB STEADY FROM BATHROOM. RETURNED TO ROOM. URINE SAMPLE OBTAINED

## 2013-09-12 NOTE — ED Nursing Note (Signed)
Seen and evaluated by resident MD

## 2013-09-12 NOTE — ED Nursing Note (Signed)
Pt called for revital no answer.

## 2013-09-12 NOTE — ED Nursing Note (Signed)
Pt tearful, "having pain in my back and legs, and it bubbles up on my left side under my ribs." VSS, no significant changes to condition.

## 2013-09-12 NOTE — Discharge Instructions (Signed)
 Thank you for choosing [facility] for your emergency health care needs. It has been our privilege to take care of you today.  You have been treated for back pain, numbness and discharged home. Please take all medicines that are prescribed to you as directed.  It is crucial, if you have a primary care physician, to follow up with him or her in the time frame recommended as many health conditions that seem self-limited initially may actually worsen over time.  If you do not have a primary care physician, we will outline the various resources available for you to find one.     If at any time you feel that your condition is worsening, call your doctor or return to the emergency department for reevaluation.      Please realize that the results of some studies that you had done during your stay with [place] (such as xrays and cultures) are only preliminarily resulted.  Results of these studies may change as more information becomes available or as the studies are reevaluated by other members of our health care team in the next few days. We will attempt to contact you with any important changes or additions to the studies that were obtained today.                         [facility] Referral List  [facility] Groups, Hours 8:00 am to 5:00 pm    [employer], [address] [place], [place] [address]  [phone]    [facility], [address] [place], [place] [address]    [place] [facility], [address] [place], [place] [address] [phone]    [facility], [address], [address], [phone]    [facility], [address]  [phone], [address]  [phone]    [address], [address] Cindie Laroche [place], [place] [address]  [phone]    Urgent [facility] Clinic, [place]. Wed. Fri. 5:00 pm to 8:00 pm, Sat & Sun 10:00am - 4:00pm    [facility], [address], [place], [place] [address]   [phone]     [address], [address] [place], [place] [address]  [phone]    [place] [facility], [address], [place], [place] [address]  [address] [place], [place], [place] [address]  [phone]    [facility], [address], [place] [address]  [phone]    [facility], [address] [place], [place] [address]  [phone]    [facility], [address] [place], [place] [address]  [phone]    [facility], [address] [place], [place] [address]  [phone]    [address] [place], [place]  [phone]    [place], [address] [place], [place]  [phone]    [place], [address], [place], [place]  [phone]    [facility], [address], [place], [place]  [phone]    [place], [address], [place], [place]  [phone]    24 hour facts of life line  [phone]    Additional [facility], [address] [place], [place]  [phone]    [facility], [address] Kathlene Cote [place], [place]  [phone] (7 days a week)    [employer]  [address] [place], [place]  [phone] (5pm - 8pm, M-F)    [facility], [address] [place], [place]  [phone]    [facility], [address] [place]  [phone]    [facility], [address] [place], [place]  [phone]    [facility], [address] [place], [place]  [phone]     [facility], [address] [place], [place]  [phone]    [facility], [address], [place], [place]  [phone]    [facility], [address] [place], [place], [place]  [phone]    Med [facility], [address], [place], [place]  [phone]    Med [facility]gent [facility], [address], [place], [place]  [phone]    [facility], [address], [place], [place]  [phone]    [facility], [address], [place], [place]  [phone]    [facility], [facility], [place], [place]  [phone]    [facility], [address] [place], [place]   [phone]    [facility], [facility] [address] [place], [place]   [phone]     [facility], [address], [place], [place]  [phone]    [facility], [address], [place], [place]  [phone]    [facility], [address], [place], [place]  [phone]    [facility], [address]  [phone]    [facility]  [address] [place], [place] [phone]  or  [address] [place] [place], [place]  [phone]  or  [address] [place], [place]  [phone]  or  [facility] [phone] [facility] [address] Len Blalock [place], [place] [phone]  [address]  [place], [place]  [phone]  [address]  [place], [place]  [phone]    [facility], [facility] for [place], [address], [place], [place]  [phone]    [facility], [address], [place], [place]  [phone]    [facility], [address] [place], [place]  [phone]    [facility], [address] [place], [place]  [phone]    [facility], [address] [phone]    [facility], [address] [place], [place]  [phone]    [facility], [address], [place], [place]  [phone]    [facility], [address] [phone]     [facility], [facility], [address], [place], [place]   [phone]

## 2013-09-12 NOTE — ED Nursing Note (Signed)
Received patient via wc  from triage who came to ED fro evalution. Pt reported GLF PTA. Re[ported feeling numbness to RLE and right arm. No facial drooping at this time. C/O ,difficulty ambulating, doing ROM to BLE. AOx4. GCS 15.lungs CTA. Placed on monitoring. Will monitor

## 2013-09-13 DIAGNOSIS — I498 Other specified cardiac arrhythmias: Secondary | ICD-10-CM

## 2013-09-13 DIAGNOSIS — R9431 Abnormal electrocardiogram [ECG] [EKG]: Secondary | ICD-10-CM

## 2013-11-01 ENCOUNTER — Emergency Department: Admission: EM | Admit: 2013-11-01 | Discharge: 2013-11-01 | Discharge status: Against Medical Advice | Payer: MEDICAID

## 2013-11-01 DIAGNOSIS — Z029 Encounter for administrative examinations, unspecified: Secondary | ICD-10-CM

## 2013-11-01 NOTE — ED Nursing Note (Signed)
No Answer" note: The patient was called using the ED PA system. The patient was not found after a search of the ED Waiting Room Areas.

## 2013-11-01 NOTE — ED Triage Note (Signed)
33 yo female co one year of an abscess sore to her innner upper gluteal fold, pt states "I think it's a spider bite", states sore is draining, also requesting a pregnancy test, pt reorts hx of bipolar non compliant denies SI or HI.

## 2013-11-01 NOTE — ED Nursing Note (Signed)
Overhead paged, no response to triage front desk

## 2013-12-01 ENCOUNTER — Emergency Department
Admission: EM | Admit: 2013-12-01 | Discharge: 2013-12-01 | Discharge status: Against Medical Advice | Payer: BLUE CROSS/BLUE SHIELD | Attending: EMERGENCY MEDICINE | Admitting: EMERGENCY MEDICINE

## 2013-12-01 DIAGNOSIS — M545 Low back pain, unspecified: Secondary | ICD-10-CM | POA: Insufficient documentation

## 2013-12-01 MED ORDER — IBUPROFEN 600 MG TABLET
600.0000 mg | ORAL_TABLET | Freq: Once | ORAL | Status: AC
Start: 2013-12-01 — End: 2013-12-01
  Administered 2013-12-01: 600 mg via ORAL
  Filled 2013-12-01: qty 1

## 2013-12-01 NOTE — ED Nursing Note (Signed)
Pt NAD.  No change in condition. Pt awaiting ER bed.  A and O x4

## 2013-12-01 NOTE — ED Progress Note (Signed)
Emergency Department Triage Note     Subjective: Kayla KocherDeanna Perkins is a [age]yr old female who presents to the ED with a chief complaint of back pain.  Ongoing for months.  States this pain is different than previous back pain.  No numbness or tingling in legs.  No bowel/bladder problems. States it makes her body overheat.    Patients phone number confirmed - .     Objective (pertinent exam findings):    BP 115/47   Pulse 103   Temp(Src) 36.7 C (98.1 F) (Oral)   Resp 18   Wt 73.936 kg (163 lb)   SpO2 99%   LMP 08/13/2013     General: in distress due to back pain. No resp distress.  Sitting in wheelchair. Able to bend forward while seated    Assessment: Kayla Perkins is a 33yr old female with low back pain.      Plan: Initial studies to evaluate this problem will include oral analgesia as needed. Further workup will then proceed in up front care. ?  Seen and initially evaluated by:  Reubin MilanEric Alan Memori Sammon, MD, Attending Physician, Department of Emergency Medicine       This patient was seen by a physician in triage. Please see the ED Initial Note for full details of this patient's care. This note covers the brief initial evaluation performed to obtain initial studies to expedite the patient's care. It is not intended to be a comprehensive document of the patient's ED visit.    This patient was instructed that this preliminary assessment and any studies obtained do not constitute a full workup of the patient's chief complaint. The patient was instructed to wait in the assigned area after the triage evaluation to be reevaluated by a physician after initial studies are completed.

## 2013-12-01 NOTE — ED Nursing Note (Signed)
No Answer" note: The patient was called using the ED PA system. The patient was not found after a search of the ED Waiting Room Areas.
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
## 2016-02-23 ENCOUNTER — Emergency Department (EMERGENCY_DEPARTMENT_HOSPITAL): Payer: BLUE CROSS/BLUE SHIELD

## 2016-02-23 ENCOUNTER — Emergency Department
Admission: EM | Admit: 2016-02-23 | Discharge: 2016-02-23 | Disposition: A | Discharge status: Home / Self Care | Payer: BLUE CROSS/BLUE SHIELD | Attending: Emergency Medicine | Admitting: Emergency Medicine

## 2016-02-23 ENCOUNTER — Other Ambulatory Visit: Payer: Self-pay

## 2016-02-23 DIAGNOSIS — S40022A Contusion of left upper arm, initial encounter: Secondary | ICD-10-CM | POA: Insufficient documentation

## 2016-02-23 DIAGNOSIS — S40021A Contusion of right upper arm, initial encounter: Secondary | ICD-10-CM

## 2016-02-23 DIAGNOSIS — F319 Bipolar disorder, unspecified: Secondary | ICD-10-CM | POA: Insufficient documentation

## 2016-02-23 DIAGNOSIS — Y939 Activity, unspecified: Secondary | ICD-10-CM | POA: Insufficient documentation

## 2016-02-23 DIAGNOSIS — Y929 Unspecified place or not applicable: Secondary | ICD-10-CM | POA: Insufficient documentation

## 2016-02-23 DIAGNOSIS — M25522 Pain in left elbow: Secondary | ICD-10-CM

## 2016-02-23 DIAGNOSIS — M7989 Other specified soft tissue disorders: Secondary | ICD-10-CM

## 2016-02-23 DIAGNOSIS — S21011A Laceration without foreign body of right breast, initial encounter: Secondary | ICD-10-CM | POA: Insufficient documentation

## 2016-02-23 MED ORDER — HYDROCODONE 5 MG-ACETAMINOPHEN 325 MG TABLET
1.0000 | ORAL_TABLET | Freq: Once | ORAL | Status: AC
Start: 2016-02-23 — End: 2016-02-23
  Administered 2016-02-23: 1 via ORAL
  Filled 2016-02-23: qty 1

## 2016-02-23 MED ORDER — IBUPROFEN 600 MG TABLET
600.0000 mg | ORAL_TABLET | Freq: Four times a day (QID) | ORAL | 0 refills | Status: AC | PRN
Start: 2016-02-23 — End: 2016-03-02
  Filled 2016-02-23: qty 30, 8d supply, fill #0
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
## 2016-02-23 NOTE — Nurse Assessment (Signed)
Pt vitals taken and amb to dress herself. MD went over instructions and gave prescription.
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
## 2016-09-22 ENCOUNTER — Emergency Department (HOSPITAL_COMMUNITY)
Admission: EM | Admit: 2016-09-22 | Discharge: 2016-09-22 | Disposition: A | Discharge status: Home / Self Care | Payer: Medicaid Other | Attending: Emergency Medicine | Admitting: Emergency Medicine

## 2016-09-22 ENCOUNTER — Encounter (HOSPITAL_COMMUNITY): Payer: Self-pay

## 2016-09-22 DIAGNOSIS — J069 Acute upper respiratory infection, unspecified: Secondary | ICD-10-CM

## 2016-09-22 DIAGNOSIS — J45901 Unspecified asthma with (acute) exacerbation: Secondary | ICD-10-CM | POA: Diagnosis not present

## 2016-09-22 DIAGNOSIS — F17210 Nicotine dependence, cigarettes, uncomplicated: Secondary | ICD-10-CM | POA: Insufficient documentation

## 2016-09-22 DIAGNOSIS — R05 Cough: Secondary | ICD-10-CM | POA: Diagnosis present

## 2016-09-22 MED ORDER — DIPHENHYDRAMINE HCL 25 MG PO CAPS: 25.0000 mg | ORAL_CAPSULE | Freq: Four times a day (QID) | ORAL | 0 refills | Status: DC | PRN

## 2016-09-22 MED ORDER — DIPHENHYDRAMINE HCL 12.5 MG/5ML PO ELIX
12.5000 mg | ORAL_SOLUTION | Freq: Once | ORAL | Status: AC
  Administered 2016-09-22: 12.5 mg via ORAL
  Filled 2016-09-22: qty 5

## 2016-09-22 MED ORDER — DEXAMETHASONE 4 MG PO TABS: 4.0000 mg | ORAL_TABLET | Freq: Two times a day (BID) | ORAL | 0 refills | Status: DC

## 2016-09-22 MED ORDER — ALBUTEROL SULFATE HFA 108 (90 BASE) MCG/ACT IN AERS
2.0000 | INHALATION_SPRAY | Freq: Once | RESPIRATORY_TRACT | Status: AC
  Administered 2016-09-22: 2 via RESPIRATORY_TRACT
  Filled 2016-09-22: qty 6.7

## 2016-09-22 MED ORDER — PREDNISONE 20 MG PO TABS
40.0000 mg | ORAL_TABLET | Freq: Once | ORAL | Status: AC
  Administered 2016-09-22: 40 mg via ORAL
  Filled 2016-09-22: qty 4
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
## 2016-09-22 NOTE — Discharge Instructions (Signed)
Your oxygen level is 98% on room air. You have some congestion in your chest, as well as some congestion in your nasal passages. Please use albuterol every 4 hours on daily, use diclofenac 2 times daily until all taken. Use Benadryl every 6 hours for congestion. Benadryl may cause drowsiness, please use this medication with caution. Please see the physicians at the adult medicine clinic until you are affiliated with a primary physician.

## 2016-09-22 NOTE — ED Triage Notes (Signed)
Patient reports of congestion and cough. Has used her medications for her asthma and had relief. Denies fever.

## 2016-09-27 ENCOUNTER — Emergency Department (HOSPITAL_COMMUNITY)
Admission: EM | Admit: 2016-09-27 | Discharge: 2016-09-27 | Disposition: A | Discharge status: Home / Self Care | Payer: Medicaid Other | Attending: Emergency Medicine | Admitting: Emergency Medicine

## 2016-09-27 ENCOUNTER — Encounter (HOSPITAL_COMMUNITY): Payer: Self-pay | Admitting: Emergency Medicine

## 2016-09-27 DIAGNOSIS — F17210 Nicotine dependence, cigarettes, uncomplicated: Secondary | ICD-10-CM | POA: Insufficient documentation

## 2016-09-27 DIAGNOSIS — S93402A Sprain of unspecified ligament of left ankle, initial encounter: Secondary | ICD-10-CM | POA: Diagnosis not present

## 2016-09-27 DIAGNOSIS — Y92410 Unspecified street and highway as the place of occurrence of the external cause: Secondary | ICD-10-CM | POA: Diagnosis not present

## 2016-09-27 DIAGNOSIS — X501XXA Overexertion from prolonged static or awkward postures, initial encounter: Secondary | ICD-10-CM | POA: Diagnosis not present

## 2016-09-27 DIAGNOSIS — Y9301 Activity, walking, marching and hiking: Secondary | ICD-10-CM | POA: Insufficient documentation

## 2016-09-27 DIAGNOSIS — Y999 Unspecified external cause status: Secondary | ICD-10-CM | POA: Diagnosis not present

## 2016-09-27 DIAGNOSIS — S99912A Unspecified injury of left ankle, initial encounter: Secondary | ICD-10-CM | POA: Diagnosis present

## 2016-09-27 MED ORDER — ACETAMINOPHEN 500 MG PO TABS
1000.0000 mg | ORAL_TABLET | Freq: Once | ORAL | Status: AC
  Administered 2016-09-27: 1000 mg via ORAL
  Filled 2016-09-27: qty 2

## 2016-09-27 MED ORDER — IBUPROFEN 600 MG PO TABS: 600.0000 mg | ORAL_TABLET | Freq: Four times a day (QID) | ORAL | 0 refills | Status: DC

## 2016-09-27 MED ORDER — ONDANSETRON HCL 4 MG PO TABS
4.0000 mg | ORAL_TABLET | Freq: Once | ORAL | Status: AC
  Administered 2016-09-27: 4 mg via ORAL
  Filled 2016-09-27: qty 1

## 2016-09-27 MED ORDER — IBUPROFEN 800 MG PO TABS
800.0000 mg | ORAL_TABLET | Freq: Once | ORAL | Status: AC
Start: 2016-09-27 — End: 2016-09-27
  Administered 2016-09-27: 800 mg via ORAL
  Filled 2016-09-27: qty 1

## 2016-09-27 NOTE — ED Triage Notes (Signed)
Pt states she has been having left ankle pain since yesterday with swelling.  Denies injury.  States redness to feet from walking a lot.

## 2016-09-27 NOTE — ED Provider Notes (Signed)
[facility] Provider Note   CSN: [id number] Arrival date & time: 09/27/16  1450  By signing my name below, I, Alyssa GroveMartin Green, attest that this documentation has been prepared under the direction and in the presence of Ivery QualeHobson Rachelanne Whidby, PA-C. Electronically Signed: Alyssa GroveMartin Green, ED Scribe. 09/27/16. 3:24 PM.  History   Chief Complaint Chief Complaint  Patient presents with  . Leg Pain   The history is provided by the patient. No language interpreter was used.   HPI Comments: Jessica Odonnell is a 35 y.o. female who presents to the Emergency Department complaining of gradual onset, constant, moderate left ankle pain onset last night. Pt remembers walking on the side of the road and accidentally stepping on loose, uneven terrain before onset of pain. Pt states pain is exacerbated with walking and palpation of the joint. She reports associated swelling and redness to the area surrounding her ankle. Pt walks very frequently. She has not worn new shoes recently. Pt denies objects falling on her foot. She denies hip pain.  History reviewed. No pertinent past medical history.  There are no active problems to display for this patient.   Past Surgical History:  Procedure Laterality Date  . TONSILLECTOMY      OB History    Gravida Para Term Preterm AB Living   3       1     SAB TAB Ectopic Multiple Live Births           2     Home Medications    Prior to Admission medications   Medication Sig Start Date End Date Taking? Authorizing Provider  dexamethasone (DECADRON) 4 MG tablet Take 1 tablet (4 mg total) by mouth 2 (two) times daily with a meal. 09/22/16   Ivery QualeHobson Kacey Vicuna, PA-C  diphenhydrAMINE (BENADRYL) 25 mg capsule Take 1 capsule (25 mg total) by mouth every 6 (six) hours as needed. For congestion 09/22/16   Ivery QualeHobson Jorgen Wolfinger, PA-C    Family History History reviewed. No pertinent family history.  Social History Social History  Substance Use Topics  . Smoking status: Current Every  Day Smoker    Packs/day: 0.50    Years: 15.00    Types: Cigarettes  . Smokeless tobacco: Never Used  . Alcohol use No   Allergies   Patient has no known allergies.  Review of Systems Review of Systems  Constitutional: Negative for fever.  Musculoskeletal: Positive for arthralgias and joint swelling.  Skin: Positive for color change. Negative for wound.  All other systems reviewed and are negative.  Physical Exam Updated Vital Signs BP 108/93 (BP Location: Right Arm)   Pulse 70   Temp 98 F (36.7 C) (Oral)   Resp 18   Ht 5\' 6"  (1.676 m)   Wt 163 lb (73.9 kg)   LMP 09/15/2016   SpO2 100%   Breastfeeding? Unknown   BMI 26.31 kg/m   Physical Exam  Constitutional: She is oriented to person, place, and time. She appears well-developed and well-nourished. She is active. No distress.  HENT:  Head: Normocephalic and atraumatic.  Eyes: Conjunctivae are normal.  Cardiovascular: Normal rate.   Dorsalis pedis is symmetrical and 2+ Posterior tibial is 2+  Pulmonary/Chest: Effort normal. No respiratory distress.  Musculoskeletal: Normal range of motion.  No calf swelling No deformity of the tibial area Pain on dorsum of the left foot Good ROM of toes Pain with ROM of the left ankle FROM of the left hip  Neurological: She is alert and oriented to  person, place, and time.  Skin: Skin is warm and dry.  Psychiatric: She has a normal mood and affect. Her behavior is normal.  Nursing note and vitals reviewed.  ED Treatments / Results  DIAGNOSTIC STUDIES: Oxygen Saturation is 100% on RA, normal by my interpretation.    COORDINATION OF CARE: 3:14 PM Discussed treatment plan with pt at bedside which includes application of ice and ankle stirrup and pt agreed to plan.  Labs (all labs ordered are listed, but only abnormal results are displayed) Labs Reviewed - No data to display  EKG  EKG Interpretation None       Radiology No results found.  Procedures Procedures  (including critical care time)  Medications Ordered in ED Medications - No data to display   Initial Impression / Assessment and Plan / ED Course  I have reviewed the triage vital signs and the nursing notes.  Pertinent labs & imaging results that were available during my care of the patient were reviewed by me and considered in my medical decision making (see chart for details).  Clinical Course    **I personally performed the services described in this documentation, which was scribed in my presence. The recorded information has been reviewed and is accurate.*    Final Clinical Impressions(s) / ED Diagnoses  MDM No neurovascular deformity appreciated. No evidence of hot joints. Doubt septic joints. Plan will be ankle stirrup , crutches, ice, elevation, Ibuprofen and orthopedic referral if no improvement.  Final diagnoses:  Sprain of left ankle, unspecified ligament, initial encounter   New Prescriptions New Prescriptions   No medications on file     Ivery QualeHobson Alphus Zeck, PA-C 09/27/16 1539    Maia PlanJoshua G Long, MD 09/27/16 [phone]

## 2016-09-27 NOTE — Discharge Instructions (Signed)
Please apply ice to the ankle. Please keep it elevated above your waist is much as possible very use crutches when up and about until you can safely apply weight to your left ankle. Use ibuprofen 600 mg with breakfast, lunch, and dinner. Use Tylenol in between the ibuprofen doses.

## 2016-10-07 ENCOUNTER — Emergency Department (HOSPITAL_COMMUNITY)
Admission: EM | Admit: 2016-10-07 | Discharge: 2016-10-07 | Disposition: A | Discharge status: Home / Self Care | Payer: No Typology Code available for payment source | Attending: Emergency Medicine | Admitting: Emergency Medicine

## 2016-10-07 ENCOUNTER — Encounter (HOSPITAL_COMMUNITY): Payer: Self-pay

## 2016-10-07 DIAGNOSIS — Z79899 Other long term (current) drug therapy: Secondary | ICD-10-CM | POA: Insufficient documentation

## 2016-10-07 DIAGNOSIS — F909 Attention-deficit hyperactivity disorder, unspecified type: Secondary | ICD-10-CM | POA: Insufficient documentation

## 2016-10-07 DIAGNOSIS — F39 Unspecified mood [affective] disorder: Secondary | ICD-10-CM | POA: Diagnosis present

## 2016-10-07 DIAGNOSIS — F17210 Nicotine dependence, cigarettes, uncomplicated: Secondary | ICD-10-CM | POA: Diagnosis not present

## 2016-10-07 HISTORY — DX: Attention-deficit hyperactivity disorder, unspecified type: F90.9

## 2016-10-07 HISTORY — DX: Unspecified convulsions: R56.9

## 2016-10-07 HISTORY — DX: Anxiety disorder, unspecified: F41.9

## 2016-10-07 HISTORY — DX: Schizophrenia, unspecified: F20.9

## 2016-10-07 HISTORY — DX: Bipolar disorder, unspecified: F31.9

## 2016-10-07 LAB — COMPREHENSIVE METABOLIC PANEL
ALBUMIN: 4.1 g/dL (ref 3.5–5.0)
ALT: 14 U/L (ref 14–54)
AST: 14 U/L — AB (ref 15–41)
Alkaline Phosphatase: 48 U/L (ref 38–126)
Anion gap: 8 (ref 5–15)
BUN: 11 mg/dL (ref 6–20)
CHLORIDE: 108 mmol/L (ref 101–111)
CO2: 19 mmol/L — AB (ref 22–32)
CREATININE: 0.76 mg/dL (ref 0.44–1.00)
Calcium: 8.7 mg/dL — ABNORMAL LOW (ref 8.9–10.3)
GFR calc Af Amer: >60 mL/min (ref >60)
GFR calc non Af Amer: >60 mL/min (ref >60)
GLUCOSE: 97 mg/dL (ref 65–99)
POTASSIUM: 4 mmol/L (ref 3.5–5.1)
SODIUM: 135 mmol/L (ref 135–145)
Total Bilirubin: 0.4 mg/dL (ref 0.3–1.2)
Total Protein: 7.4 g/dL (ref 6.5–8.1)

## 2016-10-07 LAB — RAPID URINE DRUG SCREEN, HOSP PERFORMED
AMPHETAMINES: NOT DETECTED
BENZODIAZEPINES: NOT DETECTED
Barbiturates: NOT DETECTED
Cocaine: NOT DETECTED
Opiates: NOT DETECTED
TETRAHYDROCANNABINOL: NOT DETECTED

## 2016-10-07 LAB — SALICYLATE LEVEL: Salicylate Lvl: <7 mg/dL (ref 2.8–30.0)

## 2016-10-07 LAB — CBC
HCT: 36.6 % (ref 36.0–46.0)
HEMOGLOBIN: 12.3 g/dL (ref 12.0–15.0)
MCH: 32.5 pg (ref 26.0–34.0)
MCHC: 33.6 g/dL (ref 30.0–36.0)
MCV: 96.8 fL (ref 78.0–100.0)
Platelets: 218 10*3/uL (ref 150–400)
RBC: 3.78 MIL/uL — AB (ref 3.87–5.11)
RDW: 13 % (ref 11.5–15.5)
WBC: 6.4 10*3/uL (ref 4.0–10.5)

## 2016-10-07 LAB — I-STAT BETA HCG BLOOD, ED (MC, WL, AP ONLY)

## 2016-10-07 LAB — ETHANOL: Alcohol, Ethyl (B): <5 mg/dL (ref <5)

## 2016-10-07 LAB — ACETAMINOPHEN LEVEL: Acetaminophen (Tylenol), Serum: <10 ug/mL — ABNORMAL LOW (ref 10–30)

## 2016-10-07 MED ORDER — ARIPIPRAZOLE 5 MG PO TABS
5.0000 mg | ORAL_TABLET | Freq: Every day | ORAL | 1 refills | Status: DC
Start: 2016-10-07 — End: 2020-02-05

## 2016-10-07 NOTE — ED Triage Notes (Addendum)
Pt c/o severe mood swing and "scrambled brain" x "a long time" and morning sickness x 3 days.  Denies pain.  Pt reports she has been w/o bipolar disorder, schizophrenia, and seizure medications x 2 months.  Denies SI/HI.  Sts intermittent auditory hallucinations.     Pt does not know what medications she is supposed to be taking.  Sts she is currently homeless.

## 2016-10-07 NOTE — ED Provider Notes (Signed)
[facility]-EMERGENCY DEPT Provider Note   CSN: [id number] Arrival date & time: 10/07/16  1120     History   Chief Complaint Chief Complaint  Patient presents with  . Mood Swings  . Morning Sickness    HPI [name] is a 36 y.o. female.  HPI Patient presents to the emergency department complaining of tearfulness and difficulty controlling her mood.  [place]  She previously was prescribed Abilify 5 mg.  She is without a prescription is requesting a refill.  No suicidal or homicidal thoughts.  She is requesting additional resources for mental health services in [place].   Past Medical History:  Diagnosis Date  . ADHD   . Anxiety   . Bipolar disorder (HCC)   . Schizophrenia (HCC)   . Seizures (HCC)     There are no active problems to display for this patient.   Past Surgical History:  Procedure Laterality Date  . TONSILLECTOMY      OB History    Gravida Para Term Preterm AB Living   3       1     SAB TAB Ectopic Multiple Live Births           2       Home Medications    Prior to Admission medications   Medication Sig Start Date End Date Taking? Authorizing Provider  albuterol (PROVENTIL HFA;VENTOLIN HFA) 108 (90 Base) MCG/ACT inhaler Inhale 2 puffs into the lungs every 6 (six) hours as needed for wheezing or shortness of breath.   Yes Historical Provider, MD  ARIPiprazole (ABILIFY) 5 MG tablet Take 1 tablet (5 mg total) by mouth daily. 10/07/16   Azalia BilisKevin Yee Joss, MD  dexamethasone (DECADRON) 4 MG tablet Take 1 tablet (4 mg total) by mouth 2 (two) times daily with a meal. Patient not taking: Reported on 10/07/2016 09/22/16   Ivery QualeHobson Bryant, PA-C  diphenhydrAMINE (BENADRYL) 25 mg capsule Take 1 capsule (25 mg total) by mouth every 6 (six) hours as needed. For congestion Patient not taking: Reported on 10/07/2016 09/22/16   Ivery QualeHobson Bryant, PA-C  ibuprofen (ADVIL,MOTRIN) 600 MG tablet Take 1 tablet (600 mg total) by mouth 4  (four) times daily. Patient not taking: Reported on 10/07/2016 09/27/16   Ivery QualeHobson Bryant, PA-C    Family History History reviewed. No pertinent family history.  Social History Social History  Substance Use Topics  . Smoking status: Current Every Day Smoker    Packs/day: 0.50    Years: 15.00    Types: Cigarettes  . Smokeless tobacco: Never Used  . Alcohol use No     Allergies   Fish allergy and Shellfish allergy   Review of Systems Review of Systems  All other systems reviewed and are negative.    Physical Exam Updated Vital Signs BP 115/94 (BP Location: Left Arm)   Pulse 92   Temp 98 F (36.7 C) (Oral)   Resp 12   LMP 09/19/2016   SpO2 100%   Physical Exam  Constitutional: She is oriented to person, place, and time. She appears well-developed and well-nourished.  HENT:  Head: Normocephalic.  Eyes: EOM are normal.  Neck: Normal range of motion.  Pulmonary/Chest: Effort normal.  Abdominal: She exhibits no distension.  Musculoskeletal: Normal range of motion.  Neurological: She is alert and oriented to person, place, and time.  Psychiatric: She has a normal mood and affect.  Nursing note and vitals reviewed.    ED Treatments / Results  Labs (all labs ordered are listed, but only abnormal results are displayed) Labs Reviewed  COMPREHENSIVE METABOLIC PANEL - Abnormal; Notable for the following:       Result Value   CO2 19 (*)    Calcium 8.7 (*)    AST 14 (*)    All other components within normal limits  ACETAMINOPHEN LEVEL - Abnormal; Notable for the following:    Acetaminophen (Tylenol), Serum <10 (*)    All other components within normal limits  CBC - Abnormal; Notable for the following:    RBC 3.78 (*)    All other components within normal limits  ETHANOL  SALICYLATE LEVEL  RAPID URINE DRUG SCREEN, HOSP PERFORMED  I-STAT BETA HCG BLOOD, ED ([facility], [facility], [facility] ONLY)    EKG  EKG Interpretation None       Radiology No results  found.  Procedures Procedures (including critical care time)  Medications Ordered in ED Medications - No data to display   Initial Impression / Assessment and Plan / ED Course  I have reviewed the triage vital signs and the nursing notes.  Pertinent labs & imaging results that were available during my care of the patient were reviewed by me and considered in my medical decision making (see chart for details).  Clinical Course     Patient read a prescription for Abilify.  Given information for [facility].  She understands to return to the ER for new or worsening symptoms  Final Clinical Impressions(s) / ED Diagnoses   Final diagnoses:  Mood disorder (HCC)    New Prescriptions New Prescriptions   ARIPIPRAZOLE (ABILIFY) 5 MG TABLET    Take 1 tablet (5 mg total) by mouth daily.     Azalia Bilis, MD 10/07/16 [phone]

## 2016-10-07 NOTE — ED Notes (Signed)
Pt has been changed into scrubs and wanded by Security.  

## 2016-10-27 ENCOUNTER — Emergency Department (HOSPITAL_COMMUNITY)
Admission: EM | Admit: 2016-10-27 | Discharge: 2016-10-27 | Discharge status: Against Medical Advice | Payer: Medicaid Other

## 2016-10-27 NOTE — ED Notes (Signed)
Pt called for x3 for triage. No answer

## 2017-11-19 ENCOUNTER — Other Ambulatory Visit: Payer: Self-pay

## 2017-11-19 ENCOUNTER — Emergency Department (HOSPITAL_COMMUNITY)
Admission: EM | Admit: 2017-11-19 | Discharge: 2017-11-19 | Disposition: A | Discharge status: Home / Self Care | Payer: MEDICAID | Attending: Emergency Medicine | Admitting: Emergency Medicine

## 2017-11-19 ENCOUNTER — Encounter (HOSPITAL_COMMUNITY): Payer: Self-pay | Admitting: Emergency Medicine

## 2017-11-19 DIAGNOSIS — A5901 Trichomonal vulvovaginitis: Secondary | ICD-10-CM | POA: Insufficient documentation

## 2017-11-19 DIAGNOSIS — L988 Other specified disorders of the skin and subcutaneous tissue: Secondary | ICD-10-CM

## 2017-11-19 DIAGNOSIS — A599 Trichomoniasis, unspecified: Secondary | ICD-10-CM

## 2017-11-19 DIAGNOSIS — F17210 Nicotine dependence, cigarettes, uncomplicated: Secondary | ICD-10-CM | POA: Insufficient documentation

## 2017-11-19 DIAGNOSIS — F209 Schizophrenia, unspecified: Secondary | ICD-10-CM | POA: Insufficient documentation

## 2017-11-19 DIAGNOSIS — Z79899 Other long term (current) drug therapy: Secondary | ICD-10-CM | POA: Insufficient documentation

## 2017-11-19 DIAGNOSIS — F319 Bipolar disorder, unspecified: Secondary | ICD-10-CM | POA: Insufficient documentation

## 2017-11-19 LAB — WET PREP, GENITAL
Clue Cells Wet Prep HPF POC: NONE SEEN
Sperm: NONE SEEN
YEAST WET PREP: NONE SEEN

## 2017-11-19 LAB — CBC WITH DIFFERENTIAL/PLATELET
BASOS ABS: 0 10*3/uL (ref 0.0–0.1)
BASOS PCT: 0 %
EOS PCT: 0 %
Eosinophils Absolute: 0 10*3/uL (ref 0.0–0.7)
HCT: 38 % (ref 36.0–46.0)
Hemoglobin: 12.8 g/dL (ref 12.0–15.0)
Lymphocytes Relative: 30 %
Lymphs Abs: 2.4 10*3/uL (ref 0.7–4.0)
MCH: 32.6 pg (ref 26.0–34.0)
MCHC: 33.7 g/dL (ref 30.0–36.0)
MCV: 96.7 fL (ref 78.0–100.0)
MONO ABS: 0.5 10*3/uL (ref 0.1–1.0)
Monocytes Relative: 7 %
Neutro Abs: 5 10*3/uL (ref 1.7–7.7)
Neutrophils Relative %: 63 %
PLATELETS: 278 10*3/uL (ref 150–400)
RBC: 3.93 MIL/uL (ref 3.87–5.11)
RDW: 13 % (ref 11.5–15.5)
WBC: 8 10*3/uL (ref 4.0–10.5)

## 2017-11-19 LAB — COMPREHENSIVE METABOLIC PANEL
ALBUMIN: 4.2 g/dL (ref 3.5–5.0)
ALK PHOS: 60 U/L (ref 38–126)
ALT: 15 U/L (ref 14–54)
ANION GAP: 9 (ref 5–15)
AST: 17 U/L (ref 15–41)
BILIRUBIN TOTAL: 0.7 mg/dL (ref 0.3–1.2)
BUN: 15 mg/dL (ref 6–20)
CALCIUM: 8.8 mg/dL — AB (ref 8.9–10.3)
CO2: 20 mmol/L — ABNORMAL LOW (ref 22–32)
Chloride: 110 mmol/L (ref 101–111)
Creatinine, Ser: 0.73 mg/dL (ref 0.44–1.00)
GFR calc Af Amer: >60 mL/min (ref >60)
GLUCOSE: 93 mg/dL (ref 65–99)
Potassium: 3.6 mmol/L (ref 3.5–5.1)
Sodium: 139 mmol/L (ref 135–145)
TOTAL PROTEIN: 7.9 g/dL (ref 6.5–8.1)

## 2017-11-19 LAB — I-STAT BETA HCG BLOOD, ED (MC, WL, AP ONLY): I-stat hCG, quantitative: <5 m[IU]/mL (ref <5)

## 2017-11-19 MED ORDER — METRONIDAZOLE 500 MG PO TABS
2000.0000 mg | ORAL_TABLET | Freq: Once | ORAL | Status: AC
  Administered 2017-11-19: 2000 mg via ORAL
  Filled 2017-11-19: qty 4

## 2017-11-19 NOTE — ED Provider Notes (Signed)
[facility]-EMERGENCY DEPT Provider Note   CSN: [id number] Arrival date & time: 11/19/17  1400     History   Chief Complaint Chief Complaint  Patient presents with  . Chills    HPI Jessica Odonnell is a 37 y.o. female presenting for evaluation of chills.   Pt presenting with multiple complaints.  Initially, patient states her hands and lips turned blue while she was sitting outside.  This lasted for 30 minutes.  She states it was because she was cold.  This improved as soon as she got in the ambulance.  She denies pain, chest pain, or shortness of breath.  She denies history of similar.  She denies fever, chills, nasal congestion, sore throat, or cough. Patient states she is 4 months pregnant.  She has been having monthly bleeding, but thinks this is just spotting normal in pregnancy.  She has not taken a pregnancy test or followed up with OB/GYN here.  She denies nausea, vomiting, abdominal pain, or current vaginal bleeding.  She reports vaginal discharge for the past 2 weeks.  She would like to be tested for infections. Patient reporting irritation of the rectum.  She states she has brown draiange coming from her gluteal cleft for the past 10 years, since a brown recluse spider bite.  She has never had this evaluated before.  She denies bleeding or pain with bowel movements. Patient states she has a history of schizophrenia, bipolar, and seizures for which she was taking Abilify. She stopped many months ago due to concerns about taking it in pregnancy. She denies recent sz (years since last sz).  HPI  Past Medical History:  Diagnosis Date  . ADHD   . Anxiety   . Bipolar disorder (HCC)   . Schizophrenia (HCC)   . Seizures (HCC)     There are no active problems to display for this patient.   Past Surgical History:  Procedure Laterality Date  . TONSILLECTOMY      OB History    Gravida Para Term Preterm AB Living   3       1     SAB TAB Ectopic Multiple  Live Births           2       Home Medications    Prior to Admission medications   Medication Sig Start Date End Date Taking? Authorizing Provider  albuterol (PROVENTIL HFA;VENTOLIN HFA) 108 (90 Base) MCG/ACT inhaler Inhale 2 puffs into the lungs every 6 (six) hours as needed for wheezing or shortness of breath.    [provider]  ARIPiprazole (ABILIFY) 5 MG tablet Take 1 tablet (5 mg total) by mouth daily. Patient not taking: Reported on 11/19/2017 10/07/16   Azalia Bilis, MD  dexamethasone (DECADRON) 4 MG tablet Take 1 tablet (4 mg total) by mouth 2 (two) times daily with a meal. Patient not taking: Reported on 10/07/2016 09/22/16   Ivery Quale, PA-C  diphenhydrAMINE (BENADRYL) 25 mg capsule Take 1 capsule (25 mg total) by mouth every 6 (six) hours as needed. For congestion Patient not taking: Reported on 10/07/2016 09/22/16   Ivery Quale, PA-C  ibuprofen (ADVIL,MOTRIN) 600 MG tablet Take 1 tablet (600 mg total) by mouth 4 (four) times daily. Patient not taking: Reported on 10/07/2016 09/27/16   Ivery Quale, PA-C    Family History No family history on file.  Social History Social History   Tobacco Use  . Smoking status: Current Every Day Smoker    Packs/day: 0.50  Years: 15.00    Pack years: 7.50    Types: Cigarettes  . Smokeless tobacco: Never Used  Substance Use Topics  . Alcohol use: No  . Drug use: Yes    Types: Marijuana    Comment: daily     Allergies   Fish allergy and Shellfish allergy   Review of Systems Review of Systems  Gastrointestinal:       Brown drainage from gluteal cleft  Genitourinary: Positive for vaginal discharge.  Skin: Positive for color change (resolved).  All other systems reviewed and are negative.    Physical Exam Updated Vital Signs BP 132/78   Pulse 66   Temp 98.8 F (37.1 C) (Oral)   Resp 18   LMP 07/21/2017   SpO2 99%   Physical Exam  Constitutional: She is oriented to person, place, and time. She  appears well-developed and well-nourished. No distress.  HENT:  Head: Normocephalic and atraumatic.  Nose: Nose normal.  Mouth/Throat: Uvula is midline, oropharynx is clear and moist and mucous membranes are normal. Mucous membranes are not cyanotic. No tonsillar exudate.  No sign of oral cyanosis  Eyes: Conjunctivae and EOM are normal. Pupils are equal, round, and reactive to light.  Neck: Normal range of motion. Neck supple.  Cardiovascular: Normal rate, regular rhythm and intact distal pulses.  Bilateral radial pulses strong and intact. No peripheral cyanosis noted  Pulmonary/Chest: Effort normal and breath sounds normal. No respiratory distress. She has no wheezes.  Abdominal: Soft. She exhibits no distension and no mass. There is no tenderness. There is no guarding.  Genitourinary: Vagina normal and uterus normal. Rectal exam shows fissure. Rectal exam shows no tenderness. Pelvic exam was performed with patient supine. There is no rash, tenderness or lesion on the right labia. There is no rash, tenderness or lesion on the left labia. Cervix exhibits discharge. Cervix exhibits no motion tenderness and no friability. Right adnexum displays no mass, no tenderness and no fullness. Left adnexum displays no mass, no tenderness and no fullness.  Genitourinary Comments: Chaperone present. Copious thing greenish d/c without CMT or adnexal tenderness.  Fissure between colon and skin noted along gluteal cleft without signs of infection or surrounding cellulitis.  Skin chafing of bilateral inner upper legs  Musculoskeletal: Normal range of motion.  Neurological: She is alert and oriented to person, place, and time.  Skin: Skin is warm and dry.  Psychiatric: Her affect is labile.  Pt behavior and expression labile. She is over-animated.   Nursing note and vitals reviewed.  ED Treatments / Results  Labs (all labs ordered are listed, but only abnormal results are displayed) Labs Reviewed  WET PREP,  GENITAL - Abnormal; Notable for the following components:      Result Value   Trich, Wet Prep PRESENT (*)    WBC, Wet Prep HPF POC MODERATE (*)    All other components within normal limits  COMPREHENSIVE METABOLIC PANEL - Abnormal; Notable for the following components:   CO2 20 (*)    Calcium 8.8 (*)    All other components within normal limits  CBC WITH DIFFERENTIAL/PLATELET  RPR  HIV ANTIBODY (ROUTINE TESTING)  I-STAT BETA HCG BLOOD, ED ([facility], [facility], [facility] ONLY)  GC/CHLAMYDIA PROBE AMP ([place]) NOT AT [facility]    EKG  EKG Interpretation  Date/Time:  Friday November 19 2017 20:49:42 EST Ventricular Rate:  62 PR Interval:    QRS Duration: 98 QT Interval:  401 QTC Calculation: 408 R Axis:   80 Text Interpretation:  Sinus rhythm no acute st/ts no prior Confirmed by Meridee ScoreButler, Michael [id number]) on 11/19/2017 9:06:57 PM       Radiology No results found.  Procedures Procedures (including critical care time)  Medications Ordered in ED Medications  metroNIDAZOLE (FLAGYL) tablet 2,000 mg (2,000 mg Oral Given 11/19/17 2246)     Initial Impression / Assessment and Plan / ED Course  I have reviewed the triage vital signs and the nursing notes.  Pertinent labs & imaging results that were available during my care of the patient were reviewed by me and considered in my medical decision making (see chart for details).     Patient presenting for evaluation of multiple complaints.  Initial concern about blue skin change of hands and lips.  This is not present at this time.  Patient reports feeling cold when this occurred.  No sign of vascular compromise, radial pulses strong and intact bilaterally. Labs reassuring, no anemia, electrolytes stable. I do not believe there is an acute vascular emergency at this time.  Patient states she is pregnant.  HCG shows the patient is not currently pregnant. Discussed findings with pt. Pelvic showed copious amounts if thin greenish discharge. No CMP or adnexal  tenderness. Wet prep positive for trich. HIV, RPR, Gc/Cl sent. Pt to f/u with womens health.  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 No active drainage, no surrounding cellulitis. No signs of infection. Pt can f/u with GI.  At this time, pt appears safe for discharge. Return precautions given. Pt states she understands and agrees to plan.   Final Clinical Impressions(s) / ED Diagnoses   Final diagnoses:  Trichimoniasis  Fistula    ED Discharge Orders    None       Alveria ApleyCaccavale, Jeren Dufrane, PA-C 11/19/17 2347    Terrilee FilesButler, Michael C, MD 11/20/17 1300

## 2017-11-19 NOTE — ED Notes (Signed)
Per EMS pt complaint of chills onset just prior to EMS arrival. Pt denies other. With triage pt verbalizes possible pregnancy; LMP 07/21/17.

## 2017-11-19 NOTE — Discharge Instructions (Addendum)
There are some results of your pelvic exam that will return after several days.  You will receive a phone call if they are positive.  If they are positive, you need to follow-up with the health department for treatment. Follow-up with women's health for further discussion about pregnancy. Follow-up with the stomach doctors for further management of your buttocks symptoms.  Return to the emergency room if you develop fevers, persistent vomiting, or any new or worsening symptoms.

## 2017-11-20 LAB — RPR: RPR: NONREACTIVE

## 2017-11-20 LAB — HIV ANTIBODY (ROUTINE TESTING W REFLEX): HIV Screen 4th Generation wRfx: NONREACTIVE

## 2017-11-22 LAB — GC/CHLAMYDIA PROBE AMP (~~LOC~~) NOT AT ARMC
Chlamydia: NEGATIVE
Neisseria Gonorrhea: NEGATIVE

## 2019-07-30 ENCOUNTER — Other Ambulatory Visit: Payer: Self-pay

## 2019-07-30 ENCOUNTER — Ambulatory Visit (HOSPITAL_COMMUNITY)
Admission: EM | Admit: 2019-07-30 | Discharge: 2019-07-30 | Disposition: A | Discharge status: Home / Self Care | Payer: No Typology Code available for payment source | Source: Ambulatory Visit | Attending: Emergency Medicine | Admitting: Emergency Medicine

## 2019-07-30 ENCOUNTER — Encounter (HOSPITAL_COMMUNITY): Payer: Self-pay | Admitting: *Deleted

## 2019-07-30 ENCOUNTER — Emergency Department (HOSPITAL_COMMUNITY)
Admission: EM | Admit: 2019-07-30 | Discharge: 2019-07-30 | Disposition: A | Discharge status: Home / Self Care | Payer: Medicaid Other | Attending: Emergency Medicine | Admitting: Emergency Medicine

## 2019-07-30 DIAGNOSIS — T7421XA Adult sexual abuse, confirmed, initial encounter: Secondary | ICD-10-CM | POA: Insufficient documentation

## 2019-07-30 DIAGNOSIS — F17210 Nicotine dependence, cigarettes, uncomplicated: Secondary | ICD-10-CM | POA: Insufficient documentation

## 2019-07-30 DIAGNOSIS — Z0441 Encounter for examination and observation following alleged adult rape: Secondary | ICD-10-CM | POA: Diagnosis not present

## 2019-07-30 DIAGNOSIS — Z79899 Other long term (current) drug therapy: Secondary | ICD-10-CM | POA: Diagnosis not present

## 2019-07-30 LAB — POC URINE PREG, ED: Preg Test, Ur: NEGATIVE

## 2019-07-30 MED ORDER — ULIPRISTAL ACETATE 30 MG PO TABS
30.0000 mg | ORAL_TABLET | Freq: Once | ORAL | Status: AC
  Administered 2019-07-30: 30 mg via ORAL
  Filled 2019-07-30: qty 1
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
## 2019-07-30 NOTE — ED Notes (Signed)
Pt refused discharge vital signs
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
## 2019-07-30 NOTE — ED Notes (Signed)
SANE RN at bedside.

## 2019-07-30 NOTE — ED Notes (Signed)
Pt still waiting on SANE RN to get here
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
## 2019-09-27 ENCOUNTER — Emergency Department (HOSPITAL_COMMUNITY)
Admission: EM | Admit: 2019-09-27 | Discharge: 2019-09-27 | Disposition: A | Discharge status: Home / Self Care | Payer: Medicaid Other | Attending: Emergency Medicine | Admitting: Emergency Medicine

## 2019-09-27 ENCOUNTER — Other Ambulatory Visit: Payer: Self-pay

## 2019-09-27 ENCOUNTER — Emergency Department (HOSPITAL_COMMUNITY): Payer: Medicaid Other

## 2019-09-27 ENCOUNTER — Encounter (HOSPITAL_COMMUNITY): Payer: Self-pay | Admitting: Emergency Medicine

## 2019-09-27 DIAGNOSIS — Y929 Unspecified place or not applicable: Secondary | ICD-10-CM | POA: Diagnosis not present

## 2019-09-27 DIAGNOSIS — S51832A Puncture wound without foreign body of left forearm, initial encounter: Secondary | ICD-10-CM | POA: Diagnosis not present

## 2019-09-27 DIAGNOSIS — S61532A Puncture wound without foreign body of left wrist, initial encounter: Secondary | ICD-10-CM | POA: Diagnosis not present

## 2019-09-27 DIAGNOSIS — W540XXA Bitten by dog, initial encounter: Secondary | ICD-10-CM | POA: Diagnosis not present

## 2019-09-27 DIAGNOSIS — Z23 Encounter for immunization: Secondary | ICD-10-CM | POA: Diagnosis not present

## 2019-09-27 DIAGNOSIS — Y999 Unspecified external cause status: Secondary | ICD-10-CM | POA: Diagnosis not present

## 2019-09-27 DIAGNOSIS — Z79899 Other long term (current) drug therapy: Secondary | ICD-10-CM | POA: Diagnosis not present

## 2019-09-27 DIAGNOSIS — Z2914 Encounter for prophylactic rabies immune globin: Secondary | ICD-10-CM | POA: Insufficient documentation

## 2019-09-27 DIAGNOSIS — Z203 Contact with and (suspected) exposure to rabies: Secondary | ICD-10-CM | POA: Diagnosis not present

## 2019-09-27 DIAGNOSIS — F17210 Nicotine dependence, cigarettes, uncomplicated: Secondary | ICD-10-CM | POA: Diagnosis not present

## 2019-09-27 DIAGNOSIS — X58XXXA Exposure to other specified factors, initial encounter: Secondary | ICD-10-CM | POA: Insufficient documentation

## 2019-09-27 DIAGNOSIS — Y939 Activity, unspecified: Secondary | ICD-10-CM | POA: Insufficient documentation

## 2019-09-27 DIAGNOSIS — S30811A Abrasion of abdominal wall, initial encounter: Secondary | ICD-10-CM | POA: Diagnosis not present

## 2019-09-27 DIAGNOSIS — S51802A Unspecified open wound of left forearm, initial encounter: Secondary | ICD-10-CM | POA: Diagnosis present

## 2019-09-27 MED ORDER — RABIES IMMUNE GLOBULIN 150 UNIT/ML IM INJ
20.0000 [IU]/kg | INJECTION | Freq: Once | INTRAMUSCULAR | Status: AC
  Administered 2019-09-27: 1725 [IU] via INTRAMUSCULAR
  Filled 2019-09-27: qty 11.5

## 2019-09-27 MED ORDER — RABIES VACCINE, PCEC IM SUSR
1.0000 mL | Freq: Once | INTRAMUSCULAR | Status: AC
  Administered 2019-09-27: 1 mL via INTRAMUSCULAR
  Filled 2019-09-27: qty 1

## 2019-09-27 MED ORDER — AMOXICILLIN-POT CLAVULANATE 875-125 MG PO TABS
1.0000 | ORAL_TABLET | Freq: Once | ORAL | Status: AC
  Administered 2019-09-27: 16:00:00 1 via ORAL
  Filled 2019-09-27: qty 1

## 2019-09-27 MED ORDER — BACITRACIN ZINC 500 UNIT/GM EX OINT: 1.0000 "application " | TOPICAL_OINTMENT | Freq: Two times a day (BID) | CUTANEOUS | 0 refills | Status: DC

## 2019-09-27 MED ORDER — AMOXICILLIN-POT CLAVULANATE 875-125 MG PO TABS: 1.0000 | ORAL_TABLET | Freq: Two times a day (BID) | ORAL | 0 refills | Status: DC

## 2019-09-27 MED ORDER — HYDROCODONE-ACETAMINOPHEN 5-325 MG PO TABS
1.0000 | ORAL_TABLET | Freq: Once | ORAL | Status: AC
  Administered 2019-09-27: 1 via ORAL
  Filled 2019-09-27: qty 1

## 2019-09-27 NOTE — ED Triage Notes (Signed)
Per [facility] pt from home for dog bite on left arm while out walking her dog. Vitals: 148/88, 100HR, 99% on RA, 98.2 temp.
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
## 2019-09-27 NOTE — Discharge Instructions (Signed)
Take the antibiotics as prescribed.  Apply the bacitracin to the wounds twice daily.  Follow-up with your doctor for recheck.  You should follow-up for urgent care for the rabies vaccinations as outlined below. Return to the ED with new or worsening symptoms.                                    RABIES VACCINE FOLLOW UP  Patient's Name: Jessica Odonnell                     Original Order Date:09/27/2019  Medical Record Number: [id number]  ED Physician: Ezequiel Essex, MD Primary Diagnosis: Rabies Exposure       PCP: Marliss Coots, NP  Patient Phone Number: (home) [phone] (home)    (cell)  Telephone Information:  Mobile [phone]    (work) There is no work phone number on file. Species of Animal:     You have been seen in the Emergency Department for a possible rabies exposure. It's very important you return for the additional vaccine doses.  Please call the clinic listed below for hours of operation.   Clinic that will administer your rabies vaccines:    DAY 0:  09/27/2019      DAY 3:  09/30/2019       DAY 7:  10/04/2019     DAY 14:  10/11/2019         The 5th vaccine injection is considered for immune compromised patients only.  DAY 28:  10/25/2019

## 2020-01-18 ENCOUNTER — Encounter (HOSPITAL_COMMUNITY): Payer: Self-pay

## 2020-01-18 ENCOUNTER — Emergency Department (HOSPITAL_COMMUNITY)
Admission: EM | Admit: 2020-01-18 | Discharge: 2020-01-18 | Disposition: A | Discharge status: Home / Self Care | Payer: Medicaid Other | Attending: Emergency Medicine | Admitting: Emergency Medicine

## 2020-01-18 ENCOUNTER — Other Ambulatory Visit: Payer: Self-pay

## 2020-01-18 DIAGNOSIS — F250 Schizoaffective disorder, bipolar type: Secondary | ICD-10-CM | POA: Insufficient documentation

## 2020-01-18 DIAGNOSIS — F909 Attention-deficit hyperactivity disorder, unspecified type: Secondary | ICD-10-CM | POA: Diagnosis not present

## 2020-01-18 DIAGNOSIS — Z87891 Personal history of nicotine dependence: Secondary | ICD-10-CM | POA: Diagnosis not present

## 2020-01-18 DIAGNOSIS — M79605 Pain in left leg: Secondary | ICD-10-CM

## 2020-01-18 DIAGNOSIS — M79604 Pain in right leg: Secondary | ICD-10-CM

## 2020-01-18 DIAGNOSIS — F1290 Cannabis use, unspecified, uncomplicated: Secondary | ICD-10-CM | POA: Insufficient documentation

## 2020-01-18 DIAGNOSIS — F17210 Nicotine dependence, cigarettes, uncomplicated: Secondary | ICD-10-CM | POA: Diagnosis not present

## 2020-01-18 DIAGNOSIS — Z79899 Other long term (current) drug therapy: Secondary | ICD-10-CM | POA: Diagnosis not present

## 2020-01-18 DIAGNOSIS — R202 Paresthesia of skin: Secondary | ICD-10-CM | POA: Insufficient documentation

## 2020-01-18 LAB — URINALYSIS, ROUTINE W REFLEX MICROSCOPIC
Bacteria, UA: NONE SEEN
Bilirubin Urine: NEGATIVE
Glucose, UA: NEGATIVE mg/dL
Hgb urine dipstick: NEGATIVE
Ketones, ur: NEGATIVE mg/dL
Nitrite: NEGATIVE
Protein, ur: NEGATIVE mg/dL
Specific Gravity, Urine: 1.015 (ref 1.005–1.030)
pH: 6 (ref 5.0–8.0)

## 2020-01-18 LAB — CBC
HCT: 42.9 % (ref 36.0–46.0)
Hemoglobin: 13.7 g/dL (ref 12.0–15.0)
MCH: 32.5 pg (ref 26.0–34.0)
MCHC: 31.9 g/dL (ref 30.0–36.0)
MCV: 101.7 fL — ABNORMAL HIGH (ref 80.0–100.0)
Platelets: 289 10*3/uL (ref 150–400)
RBC: 4.22 MIL/uL (ref 3.87–5.11)
RDW: 12.6 % (ref 11.5–15.5)
WBC: 7 10*3/uL (ref 4.0–10.5)
nRBC: 0 % (ref 0.0–0.2)

## 2020-01-18 LAB — BASIC METABOLIC PANEL
Anion gap: 9 (ref 5–15)
BUN: 8 mg/dL (ref 6–20)
CO2: 24 mmol/L (ref 22–32)
Calcium: 8.8 mg/dL — ABNORMAL LOW (ref 8.9–10.3)
Chloride: 105 mmol/L (ref 98–111)
Creatinine, Ser: 0.7 mg/dL (ref 0.44–1.00)
GFR calc Af Amer: >60 mL/min (ref >60)
GFR calc non Af Amer: >60 mL/min (ref >60)
Glucose, Bld: 94 mg/dL (ref 70–99)
Potassium: 3.8 mmol/L (ref 3.5–5.1)
Sodium: 138 mmol/L (ref 135–145)

## 2020-01-18 LAB — D-DIMER, QUANTITATIVE: D-Dimer, Quant: 0.51 ug/mL-FEU — ABNORMAL HIGH (ref 0.00–0.50)

## 2020-01-18 LAB — I-STAT BETA HCG BLOOD, ED (MC, WL, AP ONLY): I-stat hCG, quantitative: <5 m[IU]/mL (ref <5)

## 2020-01-18 LAB — TSH: TSH: 2.817 u[IU]/mL (ref 0.350–4.500)

## 2020-01-18 NOTE — ED Notes (Signed)
Sent a urine culture with the urine specimen 
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
## 2020-01-18 NOTE — ED Provider Notes (Signed)
[facility] EMERGENCY DEPARTMENT Provider Note   CSN: [id number] Arrival date & time: 01/18/20  1400     History Chief Complaint  Patient presents with  . Tingling    Jessica Odonnell is a 39 y.o. female.  Pt presents to the ED today with tingling in her lower extremities.  Pt said it happened suddenly when she was walking her dog.  She said it is gone now.  No other associated sx.  She is able to ambulate.  No bowel or bladder incontinence.         Past Medical History:  Diagnosis Date  . ADHD   . Anxiety   . Bipolar disorder ([facility])   . Schizophrenia ([facility])   . Seizures ([facility])     There are no problems to display for this patient.   Past Surgical History:  Procedure Laterality Date  . TONSILLECTOMY       OB History    Gravida  3   Para      Term      Preterm      AB  1   Living        SAB      TAB      Ectopic      Multiple      Live Births  2           History reviewed. No pertinent family history.  Social History   Tobacco Use  . Smoking status: Current Every Day Smoker    Packs/day: 0.50    Years: 15.00    Pack years: 7.50    Types: Cigarettes  . Smokeless tobacco: Never Used  Substance Use Topics  . Alcohol use: No  . Drug use: Yes    Types: Marijuana    Comment: daily    Home Medications Prior to Admission medications   Medication Sig Start Date End Date Taking? Authorizing Provider  Prenatal Vit-DSS-Fe Cbn-FA (PRENATAL AD PO) Take 1 tablet by mouth daily.   Yes [provider]  amoxicillin-clavulanate (AUGMENTIN) 875-125 MG tablet Take 1 tablet by mouth every 12 (twelve) hours. Patient not taking: Reported on 01/18/2020 09/27/19   Glynn Octave, MD  ARIPiprazole (ABILIFY) 5 MG tablet Take 1 tablet (5 mg total) by mouth daily. Patient not taking: Reported on 11/19/2017 10/07/16   Azalia Bilis, MD  bacitracin ointment Apply 1 application topically 2 (two) times daily. Patient not taking: Reported  on 01/18/2020 09/27/19   Glynn Octave, MD  dexamethasone (DECADRON) 4 MG tablet Take 1 tablet (4 mg total) by mouth 2 (two) times daily with a meal. Patient not taking: Reported on 10/07/2016 09/22/16   Ivery Quale, PA-C  diphenhydrAMINE (BENADRYL) 25 mg capsule Take 1 capsule (25 mg total) by mouth every 6 (six) hours as needed. For congestion Patient not taking: Reported on 10/07/2016 09/22/16   Ivery Quale, PA-C  ibuprofen (ADVIL,MOTRIN) 600 MG tablet Take 1 tablet (600 mg total) by mouth 4 (four) times daily. Patient not taking: Reported on 10/07/2016 09/27/16   Ivery Quale, PA-C    Allergies    Fish allergy and Shellfish allergy  Review of Systems   Review of Systems  Neurological: Positive for numbness.  All other systems reviewed and are negative.   Physical Exam Updated Vital Signs BP 116/71   Pulse (!) 53   Temp 98.1 F (36.7 C) (Oral)   Resp 16   SpO2 99%   Physical Exam Vitals and nursing note reviewed.  Constitutional:  Appearance: Normal appearance.  HENT:     Head: Normocephalic and atraumatic.     Right Ear: External ear normal.     Left Ear: External ear normal.     Nose: Nose normal.     Mouth/Throat:     Mouth: Mucous membranes are moist.     Pharynx: Oropharynx is clear.  Eyes:     Extraocular Movements: Extraocular movements intact.     Conjunctiva/sclera: Conjunctivae normal.     Pupils: Pupils are equal, round, and reactive to light.  Cardiovascular:     Rate and Rhythm: Normal rate and regular rhythm.     Pulses: Normal pulses.     Heart sounds: Normal heart sounds.  Pulmonary:     Effort: Pulmonary effort is normal.     Breath sounds: Normal breath sounds.  Abdominal:     General: Abdomen is flat. Bowel sounds are normal.     Palpations: Abdomen is soft.  Musculoskeletal:        General: Normal range of motion.     Cervical back: Normal range of motion and neck supple.  Skin:    General: Skin is warm.     Capillary Refill:  Capillary refill takes less than 2 seconds.  Neurological:     General: No focal deficit present.     Mental Status: She is alert and oriented to person, place, and time.  Psychiatric:        Mood and Affect: Mood normal.        Behavior: Behavior normal.        Thought Content: Thought content normal.        Judgment: Judgment normal.     ED Results / Procedures / Treatments   Labs (all labs ordered are listed, but only abnormal results are displayed) Labs Reviewed  CBC - Abnormal; Notable for the following components:      Result Value   MCV 101.7 (*)    All other components within normal limits  BASIC METABOLIC PANEL - Abnormal; Notable for the following components:   Calcium 8.8 (*)    All other components within normal limits  D-DIMER, QUANTITATIVE (NOT AT [facility]) - Abnormal; Notable for the following components:   D-Dimer, Quant 0.51 (*)    All other components within normal limits  URINALYSIS, ROUTINE W REFLEX MICROSCOPIC - Abnormal; Notable for the following components:   APPearance HAZY (*)    Leukocytes,Ua TRACE (*)    All other components within normal limits  TSH  I-STAT BETA HCG BLOOD, ED ([facility], [facility], [facility] ONLY)    EKG None  Radiology No results found.  Procedures Procedures (including critical care time)  Medications Ordered in ED Medications - No data to display  ED Course  I have reviewed the triage vital signs and the nursing notes.  Pertinent labs & imaging results that were available during my care of the patient were reviewed by me and considered in my medical decision making (see chart for details).    MDM Rules/Calculators/A&P                       DDimer is minimally elevated, so I will order a [place] for the morning.  I have a low suspicion so I will not give any lovenox tonight.    Pt has been symptom free for several hours and is able to ambulate without problems.  Pt is stable for d/c.  Final Clinical Impression(s) / ED Diagnoses Final  diagnoses:  Pain in both lower extremities  Paresthesia    Rx / DC Orders ED Discharge Orders         Ordered    LE VENOUS     01/18/20 2154           Isla Pence, MD 01/18/20 2156

## 2020-01-19 ENCOUNTER — Ambulatory Visit (HOSPITAL_COMMUNITY): Payer: Medicaid Other | Attending: Emergency Medicine

## 2020-02-05 ENCOUNTER — Ambulatory Visit (HOSPITAL_COMMUNITY)
Admission: EM | Admit: 2020-02-05 | Discharge: 2020-02-05 | Disposition: A | Discharge status: Home / Self Care | Payer: No Typology Code available for payment source | Source: Ambulatory Visit | Attending: Emergency Medicine | Admitting: Emergency Medicine

## 2020-02-05 ENCOUNTER — Emergency Department (HOSPITAL_COMMUNITY)
Admission: EM | Admit: 2020-02-05 | Discharge: 2020-02-05 | Disposition: A | Discharge status: Home / Self Care | Payer: Medicaid Other | Attending: Emergency Medicine | Admitting: Emergency Medicine

## 2020-02-05 ENCOUNTER — Encounter (HOSPITAL_COMMUNITY): Payer: Self-pay | Admitting: *Deleted

## 2020-02-05 ENCOUNTER — Other Ambulatory Visit: Payer: Self-pay

## 2020-02-05 DIAGNOSIS — Z0441 Encounter for examination and observation following alleged adult rape: Secondary | ICD-10-CM | POA: Insufficient documentation

## 2020-02-05 DIAGNOSIS — F17210 Nicotine dependence, cigarettes, uncomplicated: Secondary | ICD-10-CM | POA: Diagnosis not present

## 2020-02-05 DIAGNOSIS — T7621XA Adult sexual abuse, suspected, initial encounter: Secondary | ICD-10-CM | POA: Insufficient documentation

## 2020-02-05 DIAGNOSIS — Z23 Encounter for immunization: Secondary | ICD-10-CM | POA: Diagnosis not present

## 2020-02-05 LAB — I-STAT BETA HCG BLOOD, ED (MC, WL, AP ONLY): I-stat hCG, quantitative: <5 m[IU]/mL (ref <5)

## 2020-02-05 LAB — COMPREHENSIVE METABOLIC PANEL
ALT: 16 U/L (ref 0–44)
AST: 16 U/L (ref 15–41)
Albumin: 3.7 g/dL (ref 3.5–5.0)
Alkaline Phosphatase: 53 U/L (ref 38–126)
Anion gap: 10 (ref 5–15)
BUN: 8 mg/dL (ref 6–20)
CO2: 20 mmol/L — ABNORMAL LOW (ref 22–32)
Calcium: 9 mg/dL (ref 8.9–10.3)
Chloride: 108 mmol/L (ref 98–111)
Creatinine, Ser: 0.71 mg/dL (ref 0.44–1.00)
GFR calc Af Amer: >60 mL/min (ref >60)
GFR calc non Af Amer: >60 mL/min (ref >60)
Glucose, Bld: 97 mg/dL (ref 70–99)
Potassium: 3.8 mmol/L (ref 3.5–5.1)
Sodium: 138 mmol/L (ref 135–145)
Total Bilirubin: 0.8 mg/dL (ref 0.3–1.2)
Total Protein: 7 g/dL (ref 6.5–8.1)

## 2020-02-05 LAB — HEPATITIS C ANTIBODY: HCV Ab: NONREACTIVE

## 2020-02-05 LAB — RAPID HIV SCREEN (HIV 1/2 AB+AG)
HIV 1/2 Antibodies: NONREACTIVE
HIV-1 P24 Antigen - HIV24: NONREACTIVE

## 2020-02-05 LAB — HEPATITIS B SURFACE ANTIGEN: Hepatitis B Surface Ag: NONREACTIVE

## 2020-02-05 MED ORDER — ELVITEG-COBIC-EMTRICIT-TENOFAF 150-150-200-10 MG PREPACK
ORAL_TABLET | ORAL | Status: AC
  Administered 2020-02-05: 17:00:00 5 via ORAL
  Filled 2020-02-05: qty 1

## 2020-02-05 MED ORDER — ULIPRISTAL ACETATE 30 MG PO TABS
30.0000 mg | ORAL_TABLET | Freq: Once | ORAL | Status: AC
  Administered 2020-02-05: 16:00:00 30 mg via ORAL
  Filled 2020-02-05 (×2): qty 1

## 2020-02-05 MED ORDER — ELVITEG-COBIC-EMTRICIT-TENOFAF 150-150-200-10 MG PO TABS: 1.0000 | ORAL_TABLET | Freq: Every day | ORAL | 0 refills | Status: AC

## 2020-02-05 MED ORDER — METRONIDAZOLE 500 MG PO TABS
2000.0000 mg | ORAL_TABLET | Freq: Once | ORAL | Status: AC
  Administered 2020-02-05: 16:00:00 2000 mg via ORAL
  Filled 2020-02-05 (×2): qty 4

## 2020-02-05 MED ORDER — TETANUS-DIPHTH-ACELL PERTUSSIS 5-2.5-18.5 LF-MCG/0.5 IM SUSP
0.5000 mL | Freq: Once | INTRAMUSCULAR | Status: AC
  Administered 2020-02-05: 0.5 mL via INTRAMUSCULAR
  Filled 2020-02-05: qty 0.5

## 2020-02-05 MED ORDER — PROMETHAZINE HCL 25 MG PO TABS
25.0000 mg | ORAL_TABLET | Freq: Four times a day (QID) | ORAL | Status: DC | PRN
  Administered 2020-02-05: 75 mg via ORAL
  Filled 2020-02-05 (×2): qty 1

## 2020-02-05 MED ORDER — HEPATITIS B VAC RECOMBINANT 10 MCG/ML IJ SUSP
1.0000 mL | Freq: Once | INTRAMUSCULAR | Status: AC
  Administered 2020-02-05: 16:00:00 10 ug via INTRAMUSCULAR
  Filled 2020-02-05: qty 1

## 2020-02-05 MED ORDER — AZITHROMYCIN 250 MG PO TABS
1000.0000 mg | ORAL_TABLET | Freq: Once | ORAL | Status: AC
  Administered 2020-02-05: 16:00:00 1000 mg via ORAL
  Filled 2020-02-05 (×2): qty 4

## 2020-02-05 MED ORDER — LIDOCAINE HCL (PF) 1 % IJ SOLN
0.9000 mL | Freq: Once | INTRAMUSCULAR | Status: AC
  Administered 2020-02-05: 16:00:00 0.9 mL
  Filled 2020-02-05 (×2): qty 30

## 2020-02-05 MED ORDER — CEFTRIAXONE SODIUM 500 MG IJ SOLR
250.0000 mg | Freq: Once | INTRAMUSCULAR | Status: AC
  Administered 2020-02-05: 16:00:00 250 mg via INTRAMUSCULAR
  Filled 2020-02-05 (×2): qty 500

## 2020-02-05 MED ORDER — ELVITEG-COBIC-EMTRICIT-TENOFAF 150-150-200-10 MG PREPACK
5.0000 | ORAL_TABLET | Freq: Once | ORAL | Status: AC
  Filled 2020-02-05: qty 5

## 2020-02-05 MED FILL — GENVOYA TABLET: 150-150-200 | 30 days supply | Qty: 30 | Fill #0
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
## 2020-02-05 NOTE — Discharge Instructions (Addendum)
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 that contains alcohol  cisapride  disulfiram  dronedarone  pimozide  thioridazine This medicine may also interact with the following medications:  amiodarone  birth control pills  busulfan  carbamazepine  cimetidine  cyclosporine  fluorouracil  lithium  other medicines that prolong the QT interval (cause an abnormal heart rhythm) like dofetilide, ziprasidone  phenobarbital  phenytoin  quinidine  tacrolimus  vecuronium  warfarin This list may not describe all possible interactions. Give your health care provider a list of all the medicines, herbs, non-prescription drugs, or dietary supplements you use. Also tell them if you smoke, drink alcohol, or use illegal drugs. Some items may interact with your medicine. What should I watch for while using this  medicine? Tell your doctor or health care professional if your symptoms do not improve or if they get worse. You may get drowsy or dizzy. Do not drive, use machinery, or do anything that needs mental alertness until you know how this medicine affects you. Do not stand or sit up quickly, especially if you are an older patient. This reduces the risk of dizzy or fainting spells. Ask your doctor or health care professional if you should avoid alcohol. Many nonprescription cough and cold products contain alcohol. Metronidazole can cause an unpleasant reaction when taken with alcohol. The reaction includes flushing, headache, nausea, vomiting, sweating, and increased thirst. The reaction can last from 30 minutes to several hours. If you are being treated for a sexually transmitted disease, avoid sexual contact until you have finished your treatment. Your sexual partner may also need treatment. What side effects may I notice from receiving this medicine? Side effects that you should report to your doctor or health care professional as soon as possible:  allergic reactions like skin rash or hives, swelling of the face, lips, or tongue  confusion  fast, irregular heartbeat  fever, chills, sore throat  fever with rash, swollen lymph nodes, or swelling of the face  pain, tingling, numbness in the hands or feet  redness, blistering, peeling or loosening of the skin, including inside the mouth  seizures  sign and symptoms of liver injury like dark yellow or brown urine; general ill feeling or flu-like symptoms; light colored stools; loss of appetite; nausea; right upper belly pain; unusually weak or tired; yellowing of the eyes or skin  vaginal discharge, itching, or odor in women Side effects that usually do not require medical attention (report to your doctor or health care professional if they continue or are bothersome):  changes in taste  diarrhea  headache  nausea, vomiting  stomach  pain This list may not describe all possible side effects. Call your doctor for medical advice about side effects. You may report side effects to FDA at 1-800-FDA-1088. Where should I keep my medicine? Keep out of the reach of children. Store at room temperature below 25 degrees C (77 degrees F). Protect from light. Keep container tightly closed. Throw away any unused medicine after the expiration date. NOTE: This sheet is a summary. It may not cover all possible information. If you have questions about this medicine, talk to your doctor, pharmacist, or health care provider.  2020 [employer]/Gold Standard (2018-09-13 06:52:33)   Promethazine (pack of 3 for home use) Also known as:  Phenergan  Promethazine tablets  What is this medicine? PROMETHAZINE (proe METH a zeen) is an antihistamine. It is used to treat allergic reactions and to treat or prevent nausea and vomiting from illness or motion sickness. It is also used to make you sleep before surgery, and to help  treat pain or nausea after surgery. This medicine may be used for other purposes; ask your health care provider or pharmacist if you have questions. COMMON BRAND NAME(S): Phenergan What should I tell my health care provider before I take this medicine? They need to know if you have any of these conditions:  glaucoma  high blood pressure or heart disease  kidney disease  liver disease  lung or breathing disease, like asthma  prostate trouble  pain or difficulty passing urine  seizures  an unusual or allergic reaction to promethazine or phenothiazines, other medicines, foods, dyes, or preservatives  pregnant or trying to get pregnant  breast-feeding How should I use this medicine? Take this medicine by mouth with a glass of water. Follow the directions on the prescription label. Take your doses at regular intervals. Do not take your medicine more often than directed. Talk to your pediatrician regarding the use of  this medicine in children. Special care may be needed. This medicine should not be given to infants and children younger than 13 years old. Overdosage: If you think you have taken too much of this medicine contact a poison control center or emergency room at once. NOTE: This medicine is only for you. Do not share this medicine with others. What if I miss a dose? If you miss a dose, take it as soon as you can. If it is almost time for your next dose, take only that dose. Do not take double or extra doses. What may interact with this medicine? Do not take this medicine with any of the following medications:  cisapride  dronedarone  MAOIs like Carbex, Eldepryl, Marplan, Nardil, Parnate  pimozide  quinidine, including dextromethorphan; quinidine  thioridazine This medicine may also interact with the following medications:  certain medicines for depression, anxiety, or psychotic disturbances  certain medicines for anxiety or sleep  certain medicines for seizures like carbamazepine, phenobarbital, phenytoin  certain medicines for movement abnormalities as in Parkinson's disease, or for gastrointestinal problems  epinephrine  medicines for allergies or colds  muscle relaxants  narcotic medicines for pain  other medicines that prolong the QT interval (cause an abnormal heart rhythm) like dofetilide, ziprasidone  tramadol  trimethobenzamide This list may not describe all possible interactions. Give your health care provider a list of all the medicines, herbs, non-prescription drugs, or dietary supplements you use. Also tell them if you smoke, drink alcohol, or use illegal drugs. Some items may interact with your medicine. What should I watch for while using this medicine? Tell your doctor or health care professional if your symptoms do not start to get better in 1 to 2 days. You may get drowsy or dizzy. Do not drive, use machinery, or do anything that needs mental alertness until  you know how this medicine affects you. To reduce the risk of dizzy or fainting spells, do not stand or sit up quickly, especially if you are an older patient. Alcohol may increase dizziness and drowsiness. Avoid alcoholic drinks. Your mouth may get dry. Chewing sugarless gum or sucking hard candy, and drinking plenty of water may help. Contact your doctor if the problem does not go away or is severe. This medicine may cause dry eyes and blurred vision. If you wear contact lenses you may feel some discomfort. Lubricating drops may help. See your eye doctor if the problem does not go away or is severe. This medicine can make you more sensitive to the sun. Keep out of the sun. If you cannot  avoid being in the sun, wear protective clothing and use sunscreen. Do not use sun lamps or tanning beds/booths. If you are diabetic, check your blood-sugar levels regularly. What side effects may I notice from receiving this medicine? Side effects that you should report to your doctor or health care professional as soon as possible:  blurred vision  irregular heartbeat, palpitations or chest pain  muscle or facial twitches  pain or difficulty passing urine  seizures  skin rash  slowed or shallow breathing  unusual bleeding or bruising  yellowing of the eyes or skin Side effects that usually do not require medical attention (report to your doctor or health care professional if they continue or are bothersome):  headache  nightmares, agitation, nervousness, excitability, not able to sleep (these are more likely in children)  stuffy nose This list may not describe all possible side effects. Call your doctor for medical advice about side effects. You may report side effects to FDA at 1-800-FDA-1088. Where should I keep my medicine? Keep out of the reach of children. Store at room temperature, between 20 and 25 degrees C (68 and 77 degrees F). Protect from light. Throw away any unused medicine after  the expiration date. NOTE: This sheet is a summary. It may not cover all possible information. If you have questions about this medicine, talk to your doctor, pharmacist, or health care provider.  2020 [employer]/Gold Standard (2018-09-13 08:46:17)  Elvitegravir; Cobicistat; Emtricitabine; Tenofovir Alafenamide oral tablets   What is this medicine? ELVITEGRAVIR; COBICISTAT; EMTRICITABINE; TENOFOVIR ALAFENAMIDE (el vye TEG ra veer; koe BIS i stat; em tri SIT uh bean; te NOE fo veer) is 3 antiretroviral medicines and a medication booster in 1 tablet. It is used to treat HIV. This medicine is not a cure for HIV. This medicine can lower, but not fully prevent, the risk of spreading HIV to others. This medicine may be used for other purposes; ask your health care provider or pharmacist if you have questions. COMMON BRAND NAME(S): Genvoya What should I tell my health care provider before I take this medicine? They need to know if you have any of these conditions:  kidney disease  liver disease  an unusual or allergic reaction to elvitegravir, cobicistat, emtricitabine, tenofovir, other medicines, foods, dyes, or preservatives  pregnant or trying to get pregnant  breast-feeding How should I use this medicine? Take this medicine by mouth with a glass of water. Follow the directions on the prescription label. Take this medicine with food. Take your medicine at regular intervals. Do not take your medicine more often than directed. For your anti-HIV therapy to work as well as possible, take each dose exactly as prescribed. Do not skip doses or stop your medicine even if you feel better. Skipping doses may make the HIV virus resistant to this medicine and other medicines. Do not stop taking except on your doctor's advice. Talk to your pediatrician regarding the use of this medicine in children. While this drug may be prescribed for selected conditions, precautions do apply. Overdosage: If you think you  have taken too much of this medicine contact a poison control center or emergency room at once. NOTE: This medicine is only for you. Do not share this medicine with others. What if I miss a dose? If you miss a dose, take it as soon as you can. If it is almost time for your next dose, take only that dose. Do not take double or extra doses. What may interact with this medicine?  Do not take this medicine with any of the following medications:  adefovir  alfuzosin  certain medicines for seizures like carbamazepine, phenobarbital, phenytoin  cisapride  lumacaftor; ivacaftor  lurasidone  medicines for cholesterol like lovastatin, simvastatin  medicines for headaches like dihydroergotamine, ergotamine, methylergonovine  midazolam  naloxegol  other antiviral medicines for HIV or AIDS  pimozide  rifampin  sildenafil  St. John's wort  triazolam This medicine may also interact with the following medications:  antacids  atorvastatin  bosentan  buprenorphine; naloxone  certain antibiotics like clarithromycin, telithromycin, rifabutin, rifapentine  certain medications for anxiety or sleep like buspirone, clorazepate, diazepam, estazolam, flurazepam, zolpidem  certain medicines for blood pressure or heart disease like amlodipine, diltiazem, felodipine, metoprolol, nicardipine, nifedipine, timolol, verapamil  certain medicines for depression, anxiety, or psychiatric disturbances  certain medicines for erectile dysfunction like avanafil, sildenafil, tadalafil, vardenafil  certain medicines for fungal infection like itraconazole, ketoconazole, voriconazole  certain medicines that treat or prevent blood clots like warfarin, apixaban, betrixaban, dabigatran, edoxaban, and rivaroxaban  colchicine  cyclosporine  female hormones, like estrogens and progestins and birth control pills  medicines for infection like acyclovir, cidofovir, valacyclovir, ganciclovir,  valganciclovir  medicines for irregular heart beat like amiodarone, bepridil, digoxin, disopyramide, dofetilide, flecainide, lidocaine, mexiletine, propafenone, quinidine  metformin  oxcarbazepine  phenothiazines like perphenazine, risperidone, thioridazine  salmeterol  sirolimus  steroid medicines like betamethasone, budesonide, ciclesonide, dexamethasone, fluticasone, methylprednisolone, mometasone, triamcinolone  tacrolimus This list may not describe all possible interactions. Give your health care provider a list of all the medicines, herbs, non-prescription drugs, or dietary supplements you use. Also tell them if you smoke, drink alcohol, or use illegal drugs. Some items may interact with your medicine. What should I watch for while using this medicine? Visit your doctor or health care professional for regular check ups. Discuss any new symptoms with your doctor. You will need to have important blood work done while on this medicine. HIV is spread to others through sexual or blood contact. Talk to your doctor about how to stop the spread of HIV. If you have hepatitis B, talk to your doctor if you plan to stop this medicine. The symptoms of hepatitis B may get worse if you stop this medicine. Birth control pills may not work properly while you are taking this medicine. Talk to your doctor about using an extra method of birth control. Women who can still have children must use a reliable form of barrier contraception, like a condom. What side effects may I notice from receiving this medicine? Side effects that you should report to your doctor or health care professional as soon as possible:  allergic reactions like skin rash, itching or hives, swelling of the face, lips, or tongue  breathing problems  fast, irregular heartbeat  muscle pain or weakness  signs and symptoms of kidney injury like trouble passing urine or change in the amount of urine  signs and symptoms of liver  injury like dark yellow or brown urine; general ill feeling or flu-like symptoms; light-colored stools; loss of appetite; right upper belly pain; unusually weak or tired; yellowing of the eyes or skin Side effects that usually do not require medical attention (report to your doctor or health care professional if they continue or are bothersome):  diarrhea  headache  nausea  tiredness This list may not describe all possible side effects. Call your doctor for medical advice about side effects. You may report side effects to FDA at 1-800-FDA-1088. Where should I keep my  medicine? Keep out of the reach of children. Store at room temperature below 30 degrees C (86 degrees F). Throw away any unused medicine after the expiration date. NOTE: This sheet is a summary. It may not cover all possible information. If you have questions about this medicine, talk to your doctor, pharmacist, or health care provider.  2020 [employer]/Gold Standard (2018-01-31 12:15:37)   Ceftriaxone (Injection/Shot) Also known as:  Rocephin  Ceftriaxone injection What is this medicine? CEFTRIAXONE (sef try AX one) is a cephalosporin antibiotic. It is used to treat certain kinds of bacterial infections. It will not work for colds, flu, or other viral infections. This medicine may be used for other purposes; ask your health care provider or pharmacist if you have questions. COMMON BRAND NAME(S): Ceftrisol Plus, Rocephin What should I tell my health care provider before I take this medicine? They need to know if you have any of these conditions:  any chronic illness  bowel disease, like colitis  both kidney and liver disease  high bilirubin level in newborn patients  an unusual or allergic reaction to ceftriaxone, other cephalosporin or penicillin antibiotics, foods, dyes, or preservatives  pregnant or trying to get pregnant  breast-feeding How should I use this medicine? This medicine is injected into a muscle  or infused it into a vein. It is usually given in a medical office or clinic. If you are to give this medicine you will be taught how to inject it. Follow instructions carefully. Use your doses at regular intervals. Do not take your medicine more often than directed. Do not skip doses or stop your medicine early even if you feel better. Do not stop taking except on your doctor's advice. Talk to your pediatrician regarding the use of this medicine in children. Special care may be needed. Overdosage: If you think you have taken too much of this medicine contact a poison control center or emergency room at once. NOTE: This medicine is only for you. Do not share this medicine with others. What if I miss a dose? If you miss a dose, take it as soon as you can. If it is almost time for your next dose, take only that dose. Do not take double or extra doses. What may interact with this medicine? Do not take this medicine with any of the following medications:  intravenous calcium This medicine may also interact with the following medications:  birth control pills This list may not describe all possible interactions. Give your health care provider a list of all the medicines, herbs, non-prescription drugs, or dietary supplements you use. Also tell them if you smoke, drink alcohol, or use illegal drugs. Some items may interact with your medicine. What should I watch for while using this medicine? Tell your doctor or health care provider if your symptoms do not improve or if they get worse. This medicine may cause serious skin reactions. They can happen weeks to months after starting the medicine. Contact your health care provider right away if you notice fevers or flu-like symptoms with a rash. The rash may be red or purple and then turn into blisters or peeling of the skin. Or, you might notice a red rash with swelling of the face, lips or lymph nodes in your neck or under your arms. Do not treat diarrhea  with over the counter products. Contact your doctor if you have diarrhea that lasts more than 2 days or if it is severe and watery. If you are being treated for a  sexually transmitted disease, avoid sexual contact until you have finished your treatment. Having sex can infect your sexual partner. Calcium may bind to this medicine and cause lung or kidney problems. Avoid calcium products while taking this medicine and for 48 hours after taking the last dose of this medicine. What side effects may I notice from receiving this medicine? Side effects that you should report to your doctor or health care professional as soon as possible:  allergic reactions like skin rash, itching or hives, swelling of the face, lips, or tongue  breathing problems  fever, chills  irregular heartbeat  pain when passing urine  redness, blistering, peeling, or loosening of the skin, including inside the mouth  seizures  stomach pain, cramps  unusual bleeding, bruising  unusually weak or tired Side effects that usually do not require medical attention (report to your doctor or health care professional if they continue or are bothersome):  diarrhea  dizzy, drowsy  headache  nausea, vomiting  pain, swelling, irritation where injected  stomach upset  sweating This list may not describe all possible side effects. Call your doctor for medical advice about side effects. You may report side effects to FDA at 1-800-FDA-1088. Where should I keep my medicine? Keep out of the reach of children. Store at room temperature below 25 degrees C (77 degrees F). Protect from light. Throw away any unused vials after the expiration date. NOTE: This sheet is a summary. It may not cover all possible information. If you have questions about this medicine, talk to your doctor, pharmacist, or health care provider.  2020 [employer] Standard (2018-12-23 10:10:06)
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
## 2020-02-05 NOTE — Consult Note (Signed)
The SANE/FNE (Forensic Nurse Examiner) consult has been completed. The primary RN has been notified. Please contact the SANE/FNE nurse on call (listed in Amion) with any further concerns.  
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
## 2020-02-05 NOTE — ED Notes (Signed)
SANE nurse to evaluate within hour per Dr. Rosalia Hammers

## 2020-02-06 LAB — RPR: RPR Ser Ql: NONREACTIVE

## 2020-02-11 NOTE — SANE Note (Incomplete)
-  Forensic Nursing Examination: ? ?Law Enforcement Agency: *** ? ?Case Number: *** ? ?Patient Information: ?Name: Jessica Odonnell  ? Age: 39 y.o. DOB: Feb 06, 1981 ?Gender: female  Race: {ED SANE [id number]}  Marital Status: {Desc; marital status:62} ?Address: [address] ?[place] [address] ?Telephone Information:  ?Mobile [phone]  ? ?[phone] (home)  ? ?Extended Emergency Contact Information ?Primary Emergency Contact: Leotis Shames ?Mobile Phone: [phone] ?Relation: Father ?Secondary Emergency Contact: Blenda Bridegroom ?Mobile Phone: [phone] ?Relation: Mother ? ?Patient Arrival Time to ED: *** Arrival Time of FNE: *** Arrival Time to Room: *** ?Evidence Collection Time: Begun at ***, End ***, Discharge Time of Patient *** ? ?Pertinent Medical History: ? ?Past Medical History:  ?Diagnosis Date  ?? ADHD   ?? Anxiety   ?? Bipolar disorder (HCC)   ?? Schizophrenia (HCC)   ?? Seizures (HCC)   ? ? ?Allergies  ?Allergen Reactions  ?? Fish Allergy Shortness Of Breath and Swelling  ?  ALL Seafoods  ?? Shellfish Allergy Shortness Of Breath and Swelling  ?  Swelling of tongue and throat  ?? Bee Pollen Other (See Comments)  ?  Sneezing, trouble breathing   ?? Dust Mite Extract Other (See Comments)  ?  Sneezing, trouble breathing  ?? Other Other (See Comments)  ?  Fir trees "christmas trees" cause itchy eyes  ? ? ?Social History  ? ?Tobacco Use  ?Smoking Status Current Every Day Smoker  ?? Packs/day: 0.50  ?? Years: 15.00  ?? Pack years: 7.50  ?? Types: Cigarettes  ?Smokeless Tobacco Never Used  ? ? ?  ?Prior to Admission medications   ?Medication Sig Start Date End Date Taking? Authorizing Provider  ?elvitegravir-cobicistat-emtricitabine-tenofovir (GENVOYA) 150-150-200-10 MG TABS tablet Take 1 tablet by mouth daily with breakfast. 02/05/20   Margarita Grizzle, MD  ? ? ?Genitourinary HX: {HX; GENITOURINARY; DYSURIA, DISCHARGE, [id number]} ? ? Patient's last menstrual period was 09/19/2016.   Tampon use:{CH ED SANE [id number]}  ?Gravida/Para *** ?Social History  ?

## 2020-03-06 ENCOUNTER — Ambulatory Visit: Payer: Medicaid Other

## 2020-05-04 IMAGING — CR DG FOREARM 2V*L*
2 series · 2 of 2 positions shown · non-contrast
Comparison: None.

CLINICAL DATA: Patient status post dog bite today. Initial
encounter.

EXAM:
LEFT FOREARM - 2 VIEW

[x forearm ap left (1 of 2)]
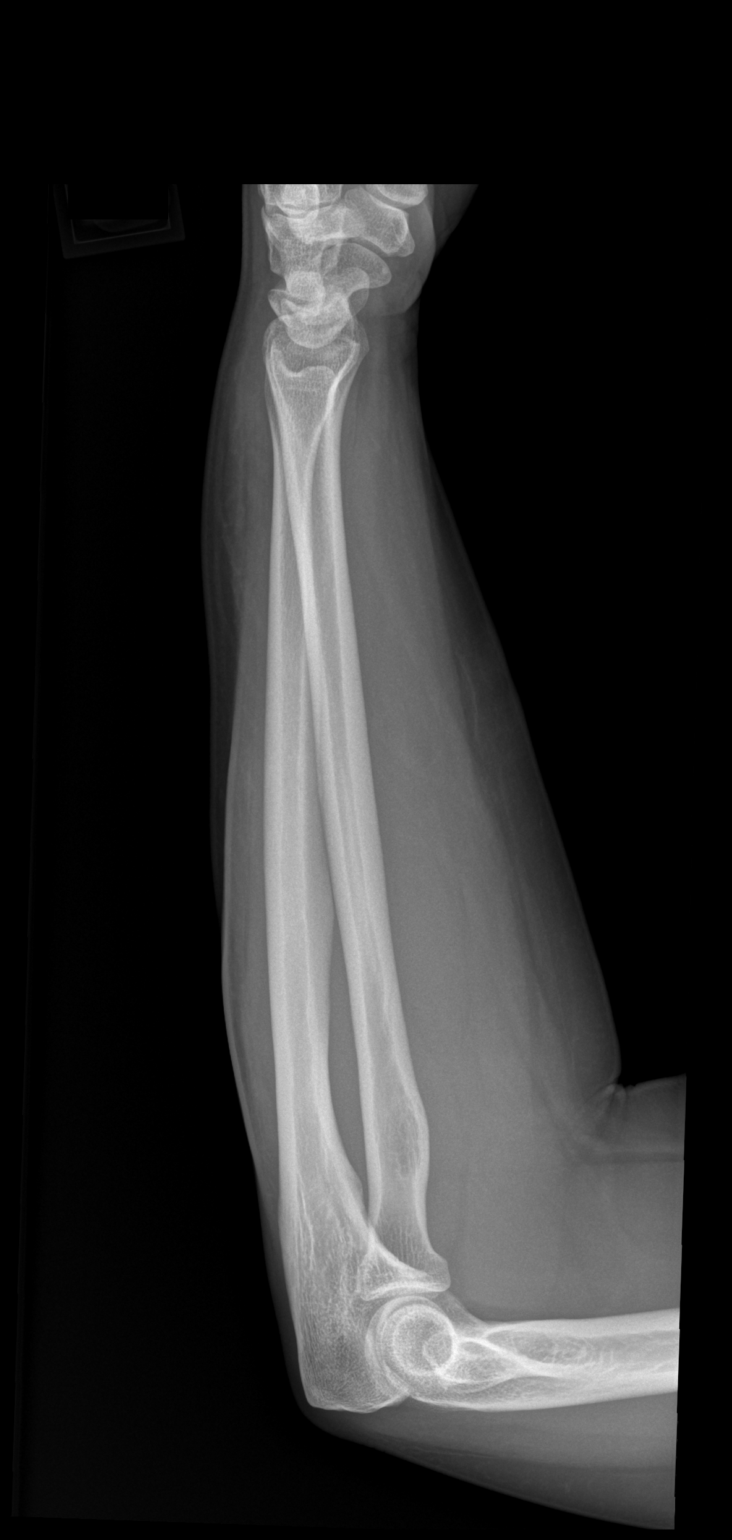

[x forearm ap left (2 of 2)]
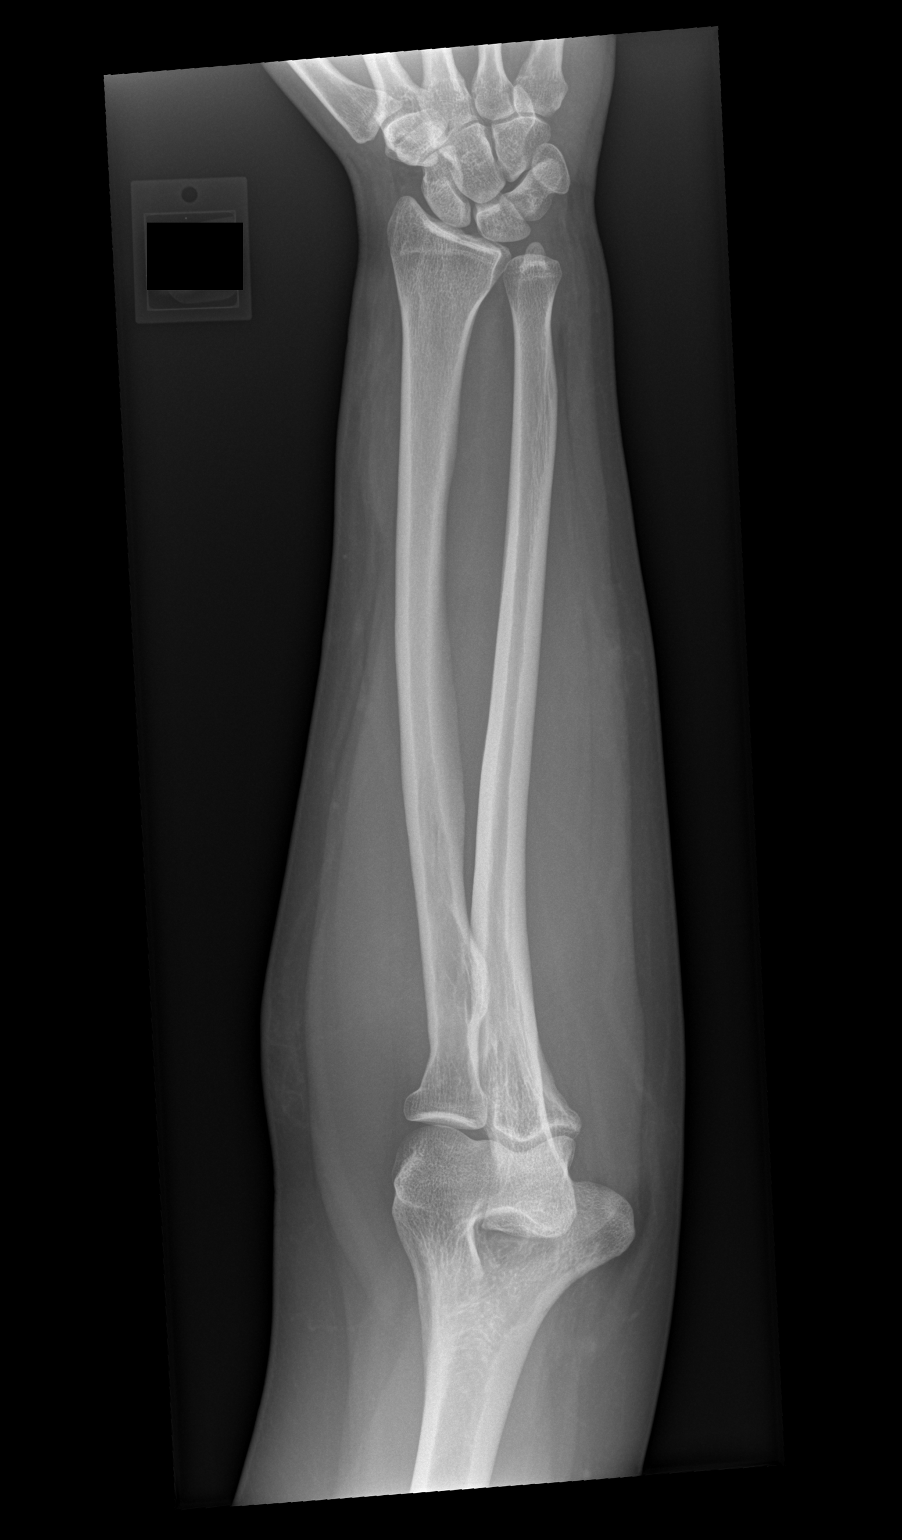

[2 of 2 positions shown; findings below may reference images not displayed]

FINDINGS: There is no acute bony or joint abnormality. No foreign body. Soft
tissues about the dorsal aspect of the distal forearm appear
swollen.
IMPRESSION: Soft tissue swelling without foreign body or fracture.
